# Patient Record
Sex: Female | Born: 1985 | Race: White | Hispanic: No | Marital: Married | Smoking: Never smoker
Health system: Southern US, Community
[De-identification: ages and names within clinical notes are randomized; demographics above are authoritative.]

## PROBLEM LIST (undated history)

## (undated) ENCOUNTER — Inpatient Hospital Stay (HOSPITAL_COMMUNITY): Payer: Self-pay

## (undated) DIAGNOSIS — F419 Anxiety disorder, unspecified: Secondary | ICD-10-CM

## (undated) DIAGNOSIS — Z789 Other specified health status: Secondary | ICD-10-CM

## (undated) DIAGNOSIS — K219 Gastro-esophageal reflux disease without esophagitis: Secondary | ICD-10-CM

## (undated) DIAGNOSIS — I499 Cardiac arrhythmia, unspecified: Secondary | ICD-10-CM

## (undated) DIAGNOSIS — R768 Other specified abnormal immunological findings in serum: Secondary | ICD-10-CM

## (undated) HISTORY — DX: Other specified health status: Z78.9

## (undated) HISTORY — DX: Other specified abnormal immunological findings in serum: R76.8

## (undated) HISTORY — PX: NO PAST SURGERIES: SHX2092

---

## 2012-07-20 DIAGNOSIS — R768 Other specified abnormal immunological findings in serum: Secondary | ICD-10-CM

## 2012-07-20 HISTORY — DX: Other specified abnormal immunological findings in serum: R76.8

## 2012-07-20 NOTE — L&D Delivery Note (Signed)
Delivery Note At 10:02 AM a viable female was delivered via Vaginal, Spontaneous Delivery (Presentation: ; Right Occiput Anterior).  APGAR: 9, 9; weight: not available at time of note.  Infant w/ spontaneous cry/respiration, placed directly on mom's abdomen for bonding/skin to skin. Cord allowed to stop pulsing, and was clamped x 2, and cut by fob.     Placenta status: Intact, Spontaneous.  Cord: 3 vessels with the following complications: placenta noted to be circummarginate .    Anesthesia: Local  Episiotomy: None Lacerations: 1st degree;Sulcus;Perineal Suture Repair: 3.0 vicryl Est. Blood Loss (mL): 450  Mom to postpartum.  Baby to nursery-stable. Plans to breastfeed, undecided about contraception, plans inpatient circumcision  Marge Duncans 04/15/2013, 11:06 AM

## 2012-07-20 NOTE — L&D Delivery Note (Signed)
Attestation of Attending Supervision of Advanced Practitioner (CNM/NP): Evaluation and management procedures were performed by the Advanced Practitioner under my supervision and collaboration.  I have reviewed the Advanced Practitioner's note and chart, and I agree with the management and plan.  HARRAWAY-SMITH, Tyshea Imel 11:52 AM     

## 2012-09-07 LAB — OB RESULTS CONSOLE ANTIBODY SCREEN: Antibody Screen: NEGATIVE

## 2012-09-07 LAB — OB RESULTS CONSOLE VARICELLA ZOSTER ANTIBODY, IGG: Varicella: IMMUNE

## 2012-09-07 LAB — OB RESULTS CONSOLE ABO/RH: RH Type: POSITIVE

## 2012-09-07 LAB — OB RESULTS CONSOLE GC/CHLAMYDIA: Gonorrhea: NEGATIVE

## 2012-09-07 LAB — OB RESULTS CONSOLE RUBELLA ANTIBODY, IGM: Rubella: IMMUNE

## 2012-09-26 ENCOUNTER — Other Ambulatory Visit: Payer: Self-pay | Admitting: Obstetrics & Gynecology

## 2012-09-26 DIAGNOSIS — Z1389 Encounter for screening for other disorder: Secondary | ICD-10-CM

## 2012-10-07 ENCOUNTER — Ambulatory Visit (INDEPENDENT_AMBULATORY_CARE_PROVIDER_SITE_OTHER): Payer: PRIVATE HEALTH INSURANCE

## 2012-10-07 ENCOUNTER — Ambulatory Visit (INDEPENDENT_AMBULATORY_CARE_PROVIDER_SITE_OTHER): Payer: PRIVATE HEALTH INSURANCE | Admitting: Obstetrics & Gynecology

## 2012-10-07 VITALS — BP 130/80 | Wt 141.0 lb

## 2012-10-07 DIAGNOSIS — IMO0002 Reserved for concepts with insufficient information to code with codable children: Secondary | ICD-10-CM

## 2012-10-07 DIAGNOSIS — Z3481 Encounter for supervision of other normal pregnancy, first trimester: Secondary | ICD-10-CM

## 2012-10-07 DIAGNOSIS — Z1389 Encounter for screening for other disorder: Secondary | ICD-10-CM

## 2012-10-07 LAB — POCT URINALYSIS DIPSTICK
Blood, UA: NEGATIVE
Glucose, UA: NEGATIVE
Ketones, UA: NEGATIVE

## 2012-10-07 NOTE — Progress Notes (Signed)
U/S(11+6wks)-Twin IUP, with 1 viable pregnancy, CRL c/w LMP dates and 1 non-viable pregnancy ("Empty Sac" no yolk sac or fetal pole noted)=2.7x1.4cm, cx long and closed, bilateral adnexa wnl

## 2012-10-07 NOTE — Patient Instructions (Signed)
Pregnancy - First Trimester During sexual intercourse, millions of sperm go into the vagina. Only 1 sperm will penetrate and fertilize the female egg while it is in the Fallopian tube. One week later, the fertilized egg implants into the wall of the uterus. An embryo begins to develop into a baby. At 6 to 8 weeks, the eyes and face are formed and the heartbeat can be seen on ultrasound. At the end of 12 weeks (first trimester), all the baby's organs are formed. Now that you are pregnant, you will want to do everything you can to have a healthy baby. Two of the most important things are to get good prenatal care and follow your caregiver's instructions. Prenatal care is all the medical care you receive before the baby's birth. It is given to prevent, find, and treat problems during the pregnancy and childbirth. PRENATAL EXAMS  During prenatal visits, your weight, blood pressure and urine are checked. This is done to make sure you are healthy and progressing normally during the pregnancy.  A pregnant woman should gain 25 to 35 pounds during the pregnancy. However, if you are over weight or underweight, your caregiver will advise you regarding your weight.  Your caregiver will ask and answer questions for you.  Blood work, cervical cultures, other necessary tests and a Pap test are done during your prenatal exams. These tests are done to check on your health and the probable health of your baby. Tests are strongly recommended and done for HIV with your permission. This is the virus that causes AIDS. These tests are done because medications can be given to help prevent your baby from being born with this infection should you have been infected without knowing it. Blood work is also used to find out your blood type, previous infections and follow your blood levels (hemoglobin).  Low hemoglobin (anemia) is common during pregnancy. Iron and vitamins are given to help prevent this. Later in the pregnancy, blood  tests for diabetes will be done along with any other tests if any problems develop. You may need tests to make sure you and the baby are doing well.  You may need other tests to make sure you and the baby are doing well. CHANGES DURING THE FIRST TRIMESTER (THE FIRST 3 MONTHS OF PREGNANCY) Your body goes through many changes during pregnancy. They vary from person to person. Talk to your caregiver about changes you notice and are concerned about. Changes can include:  Your menstrual period stops.  The egg and sperm carry the genes that determine what you look like. Genes from you and your partner are forming a baby. The female genes determine whether the baby is a boy or a girl.  Your body increases in girth and you may feel bloated.  Feeling sick to your stomach (nauseous) and throwing up (vomiting). If the vomiting is uncontrollable, call your caregiver.  Your breasts will begin to enlarge and become tender.  Your nipples may stick out more and become darker.  The need to urinate more. Painful urination may mean you have a bladder infection.  Tiring easily.  Loss of appetite.  Cravings for certain kinds of food.  At first, you may gain or lose a couple of pounds.  You may have changes in your emotions from day to day (excited to be pregnant or concerned something may go wrong with the pregnancy and baby).  You may have more vivid and strange dreams. HOME CARE INSTRUCTIONS   It is very important   to avoid all smoking, alcohol and un-prescribed drugs during your pregnancy. These affect the formation and growth of the baby. Avoid chemicals while pregnant to ensure the delivery of a healthy infant.  Start your prenatal visits by the 12th week of pregnancy. They are usually scheduled monthly at first, then more often in the last 2 months before delivery. Keep your caregiver's appointments. Follow your caregiver's instructions regarding medication use, blood and lab tests, exercise, and  diet.  During pregnancy, you are providing food for you and your baby. Eat regular, well-balanced meals. Choose foods such as meat, fish, milk and other low fat dairy products, vegetables, fruits, and whole-grain breads and cereals. Your caregiver will tell you of the ideal weight gain.  You can help morning sickness by keeping soda crackers at the bedside. Eat a couple before arising in the morning. You may want to use the crackers without salt on them.  Eating 4 to 5 small meals rather than 3 large meals a day also may help the nausea and vomiting.  Drinking liquids between meals instead of during meals also seems to help nausea and vomiting.  A physical sexual relationship may be continued throughout pregnancy if there are no other problems. Problems may be early (premature) leaking of amniotic fluid from the membranes, vaginal bleeding, or belly (abdominal) pain.  Exercise regularly if there are no restrictions. Check with your caregiver or physical therapist if you are unsure of the safety of some of your exercises. Greater weight gain will occur in the last 2 trimesters of pregnancy. Exercising will help:  Control your weight.  Keep you in shape.  Prepare you for labor and delivery.  Help you lose your pregnancy weight after you deliver your baby.  Wear a good support or jogging bra for breast tenderness during pregnancy. This may help if worn during sleep too.  Ask when prenatal classes are available. Begin classes when they are offered.  Do not use hot tubs, steam rooms or saunas.  Wear your seat belt when driving. This protects you and your baby if you are in an accident.  Avoid raw meat, uncooked cheese, cat litter boxes and soil used by cats throughout the pregnancy. These carry germs that can cause birth defects in the baby.  The first trimester is a good time to visit your dentist for your dental health. Getting your teeth cleaned is OK. Use a softer toothbrush and brush  gently during pregnancy.  Ask for help if you have financial, counseling or nutritional needs during pregnancy. Your caregiver will be able to offer counseling for these needs as well as refer you for other special needs.  Do not take any medications or herbs unless told by your caregiver.  Inform your caregiver if there is any mental or physical domestic violence.  Make a list of emergency phone numbers of family, friends, hospital, and police and fire departments.  Write down your questions. Take them to your prenatal visit.  Do not douche.  Do not cross your legs.  If you have to stand for long periods of time, rotate you feet or take small steps in a circle.  You may have more vaginal secretions that may require a sanitary pad. Do not use tampons or scented sanitary pads. MEDICATIONS AND DRUG USE IN PREGNANCY  Take prenatal vitamins as directed. The vitamin should contain 1 milligram of folic acid. Keep all vitamins out of reach of children. Only a couple vitamins or tablets containing iron may be   fatal to a baby or young child when ingested.  Avoid use of all medications, including herbs, over-the-counter medications, not prescribed or suggested by your caregiver. Only take over-the-counter or prescription medicines for pain, discomfort, or fever as directed by your caregiver. Do not use aspirin, ibuprofen, or naproxen unless directed by your caregiver.  Let your caregiver also know about herbs you may be using.  Alcohol is related to a number of birth defects. This includes fetal alcohol syndrome. All alcohol, in any form, should be avoided completely. Smoking will cause low birth rate and premature babies.  Street or illegal drugs are very harmful to the baby. They are absolutely forbidden. A baby born to an addicted mother will be addicted at birth. The baby will go through the same withdrawal an adult does.  Let your caregiver know about any medications that you have to take  and for what reason you take them. MISCARRIAGE IS COMMON DURING PREGNANCY A miscarriage does not mean you did something wrong. It is not a reason to worry about getting pregnant again. Your caregiver will help you with questions you may have. If you have a miscarriage, you may need minor surgery. SEEK MEDICAL CARE IF:  You have any concerns or worries during your pregnancy. It is better to call with your questions if you feel they cannot wait, rather than worry about them. SEEK IMMEDIATE MEDICAL CARE IF:   An unexplained oral temperature above 102 F (38.9 C) develops, or as your caregiver suggests.  You have leaking of fluid from the vagina (birth canal). If leaking membranes are suspected, take your temperature and inform your caregiver of this when you call.  There is vaginal spotting or bleeding. Notify your caregiver of the amount and how many pads are used.  You develop a bad smelling vaginal discharge with a change in the color.  You continue to feel sick to your stomach (nauseated) and have no relief from remedies suggested. You vomit blood or coffee ground-like materials.  You lose more than 2 pounds of weight in 1 week.  You gain more than 2 pounds of weight in 1 week and you notice swelling of your face, hands, feet, or legs.  You gain 5 pounds or more in 1 week (even if you do not have swelling of your hands, face, legs, or feet).  You get exposed to German measles and have never had them.  You are exposed to fifth disease or chickenpox.  You develop belly (abdominal) pain. Round ligament discomfort is a common non-cancerous (benign) cause of abdominal pain in pregnancy. Your caregiver still must evaluate this.  You develop headache, fever, diarrhea, pain with urination, or shortness of breath.  You fall or are in a car accident or have any kind of trauma.  There is mental or physical violence in your home. Document Released: 06/30/2001 Document Revised: 09/28/2011  Document Reviewed: 01/01/2009 [employer] Patient Information 2013 [employer].  

## 2012-10-07 NOTE — Progress Notes (Signed)
Tina Osborn in today see sonogram report.  The patient declines any bleeding, no complaints except nausea no emesis.All questions were answered. Patient declines integrated testing and probably no Quad screen.

## 2012-10-09 LAB — US OB COMP LESS 14 WKS

## 2012-10-25 ENCOUNTER — Telehealth: Payer: Self-pay | Admitting: Obstetrics and Gynecology

## 2012-10-25 NOTE — Telephone Encounter (Signed)
Spoke with pt. Benadryl is safe during pregnancy. Advised may cause drowsiness. Advised if no improvement after a few days, call us back. Pt viced understanding.

## 2012-11-02 ENCOUNTER — Encounter: Payer: Self-pay | Admitting: *Deleted

## 2012-11-03 ENCOUNTER — Ambulatory Visit (INDEPENDENT_AMBULATORY_CARE_PROVIDER_SITE_OTHER): Payer: PRIVATE HEALTH INSURANCE | Admitting: Obstetrics & Gynecology

## 2012-11-03 VITALS — BP 122/70 | Ht 66.0 in | Wt 147.2 lb

## 2012-11-03 DIAGNOSIS — IMO0002 Reserved for concepts with insufficient information to code with codable children: Secondary | ICD-10-CM

## 2012-11-03 DIAGNOSIS — O0992 Supervision of high risk pregnancy, unspecified, second trimester: Secondary | ICD-10-CM

## 2012-11-03 DIAGNOSIS — O0990 Supervision of high risk pregnancy, unspecified, unspecified trimester: Secondary | ICD-10-CM

## 2012-11-03 NOTE — Progress Notes (Signed)
C/o hip pain, nausea, weakness, taking b/p at home was 104/68.

## 2012-11-03 NOTE — Patient Instructions (Signed)
Pregnancy - Second Trimester The second trimester of pregnancy (3 to 6 months) is a period of rapid growth for you and your baby. At the end of the sixth month, your baby is about 9 inches long and weighs 1 1/2 pounds. You will begin to feel the baby move between 18 and 20 weeks of the pregnancy. This is called quickening. Weight gain is faster. A clear fluid (colostrum) may leak out of your breasts. You may feel small contractions of the womb (uterus). This is known as false labor or Braxton-Hicks contractions. This is like a practice for labor when the baby is ready to be born. Usually, the problems with morning sickness have usually passed by the end of your first trimester. Some women develop small dark blotches (called cholasma, mask of pregnancy) on their face that usually goes away after the baby is born. Exposure to the sun makes the blotches worse. Acne may also develop in some pregnant women and pregnant women who have acne, may find that it goes away. PRENATAL EXAMS  Blood work may continue to be done during prenatal exams. These tests are done to check on your health and the probable health of your baby. Blood work is used to follow your blood levels (hemoglobin). Anemia (low hemoglobin) is common during pregnancy. Iron and vitamins are given to help prevent this. You will also be checked for diabetes between 24 and 28 weeks of the pregnancy. Some of the previous blood tests may be repeated.  The size of the uterus is measured during each visit. This is to make sure that the baby is continuing to grow properly according to the dates of the pregnancy.  Your blood pressure is checked every prenatal visit. This is to make sure you are not getting toxemia.  Your urine is checked to make sure you do not have an infection, diabetes or protein in the urine.  Your weight is checked often to make sure gains are happening at the suggested rate. This is to ensure that both you and your baby are growing  normally.  Sometimes, an ultrasound is performed to confirm the proper growth and development of the baby. This is a test which bounces harmless sound waves off the baby so your caregiver can more accurately determine due dates. Sometimes, a specialized test is done on the amniotic fluid surrounding the baby. This test is called an amniocentesis. The amniotic fluid is obtained by sticking a needle into the belly (abdomen). This is done to check the chromosomes in instances where there is a concern about possible genetic problems with the baby. It is also sometimes done near the end of pregnancy if an early delivery is required. In this case, it is done to help make sure the baby's lungs are mature enough for the baby to live outside of the womb. CHANGES OCCURING IN THE SECOND TRIMESTER OF PREGNANCY Your body goes through many changes during pregnancy. They vary from person to person. Talk to your caregiver about changes you notice that you are concerned about.  During the second trimester, you will likely have an increase in your appetite. It is normal to have cravings for certain foods. This varies from person to person and pregnancy to pregnancy.  Your lower abdomen will begin to bulge.  You may have to urinate more often because the uterus and baby are pressing on your bladder. It is also common to get more bladder infections during pregnancy (pain with urination). You can help this by   drinking lots of fluids and emptying your bladder before and after intercourse.  You may begin to get stretch marks on your hips, abdomen, and breasts. These are normal changes in the body during pregnancy. There are no exercises or medications to take that prevent this change.  You may begin to develop swollen and bulging veins (varicose veins) in your legs. Wearing support hose, elevating your feet for 15 minutes, 3 to 4 times a day and limiting salt in your diet helps lessen the problem.  Heartburn may develop  as the uterus grows and pushes up against the stomach. Antacids recommended by your caregiver helps with this problem. Also, eating smaller meals 4 to 5 times a day helps.  Constipation can be treated with a stool softener or adding bulk to your diet. Drinking lots of fluids, vegetables, fruits, and whole grains are helpful.  Exercising is also helpful. If you have been very active up until your pregnancy, most of these activities can be continued during your pregnancy. If you have been less active, it is helpful to start an exercise program such as walking.  Hemorrhoids (varicose veins in the rectum) may develop at the end of the second trimester. Warm sitz baths and hemorrhoid cream recommended by your caregiver helps hemorrhoid problems.  Backaches may develop during this time of your pregnancy. Avoid heavy lifting, wear low heal shoes and practice good posture to help with backache problems.  Some pregnant women develop tingling and numbness of their hand and fingers because of swelling and tightening of ligaments in the wrist (carpel tunnel syndrome). This goes away after the baby is born.  As your breasts enlarge, you may have to get a bigger bra. Get a comfortable, cotton, support bra. Do not get a nursing bra until the last month of the pregnancy if you will be nursing the baby.  You may get a dark line from your belly button to the pubic area called the linea nigra.  You may develop rosy cheeks because of increase blood flow to the face.  You may develop spider looking lines of the face, neck, arms and chest. These go away after the baby is born. HOME CARE INSTRUCTIONS   It is extremely important to avoid all smoking, herbs, alcohol, and unprescribed drugs during your pregnancy. These chemicals affect the formation and growth of the baby. Avoid these chemicals throughout the pregnancy to ensure the delivery of a healthy infant.  Most of your home care instructions are the same as  suggested for the first trimester of your pregnancy. Keep your caregiver's appointments. Follow your caregiver's instructions regarding medication use, exercise and diet.  During pregnancy, you are providing food for you and your baby. Continue to eat regular, well-balanced meals. Choose foods such as meat, fish, milk and other low fat dairy products, vegetables, fruits, and whole-grain breads and cereals. Your caregiver will tell you of the ideal weight gain.  A physical sexual relationship may be continued up until near the end of pregnancy if there are no other problems. Problems could include early (premature) leaking of amniotic fluid from the membranes, vaginal bleeding, abdominal pain, or other medical or pregnancy problems.  Exercise regularly if there are no restrictions. Check with your caregiver if you are unsure of the safety of some of your exercises. The greatest weight gain will occur in the last 2 trimesters of pregnancy. Exercise will help you:  Control your weight.  Get you in shape for labor and delivery.  Lose weight   after you have the baby.  Wear a good support or jogging bra for breast tenderness during pregnancy. This may help if worn during sleep. Pads or tissues may be used in the bra if you are leaking colostrum.  Do not use hot tubs, steam rooms or saunas throughout the pregnancy.  Wear your seat belt at all times when driving. This protects you and your baby if you are in an accident.  Avoid raw meat, uncooked cheese, cat litter boxes and soil used by cats. These carry germs that can cause birth defects in the baby.  The second trimester is also a good time to visit your dentist for your dental health if this has not been done yet. Getting your teeth cleaned is OK. Use a soft toothbrush. Brush gently during pregnancy.  It is easier to loose urine during pregnancy. Tightening up and strengthening the pelvic muscles will help with this problem. Practice stopping your  urination while you are going to the bathroom. These are the same muscles you need to strengthen. It is also the muscles you would use as if you were trying to stop from passing gas. You can practice tightening these muscles up 10 times a set and repeating this about 3 times per day. Once you know what muscles to tighten up, do not perform these exercises during urination. It is more likely to contribute to an infection by backing up the urine.  Ask for help if you have financial, counseling or nutritional needs during pregnancy. Your caregiver will be able to offer counseling for these needs as well as refer you for other special needs.  Your skin may become oily. If so, wash your face with mild soap, use non-greasy moisturizer and oil or cream based makeup. MEDICATIONS AND DRUG USE IN PREGNANCY  Take prenatal vitamins as directed. The vitamin should contain 1 milligram of folic acid. Keep all vitamins out of reach of children. Only a couple vitamins or tablets containing iron may be fatal to a baby or young child when ingested.  Avoid use of all medications, including herbs, over-the-counter medications, not prescribed or suggested by your caregiver. Only take over-the-counter or prescription medicines for pain, discomfort, or fever as directed by your caregiver. Do not use aspirin.  Let your caregiver also know about herbs you may be using.  Alcohol is related to a number of birth defects. This includes fetal alcohol syndrome. All alcohol, in any form, should be avoided completely. Smoking will cause low birth rate and premature babies.  Street or illegal drugs are very harmful to the baby. They are absolutely forbidden. A baby born to an addicted mother will be addicted at birth. The baby will go through the same withdrawal an adult does. SEEK MEDICAL CARE IF:  You have any concerns or worries during your pregnancy. It is better to call with your questions if you feel they cannot wait, rather  than worry about them. SEEK IMMEDIATE MEDICAL CARE IF:   An unexplained oral temperature above 102 F (38.9 C) develops, or as your caregiver suggests.  You have leaking of fluid from the vagina (birth canal). If leaking membranes are suspected, take your temperature and tell your caregiver of this when you call.  There is vaginal spotting, bleeding, or passing clots. Tell your caregiver of the amount and how many pads are used. Light spotting in pregnancy is common, especially following intercourse.  You develop a bad smelling vaginal discharge with a change in the color from clear   to white.  You continue to feel sick to your stomach (nauseated) and have no relief from remedies suggested. You vomit blood or coffee ground-like materials.  You lose more than 2 pounds of weight or gain more than 2 pounds of weight over 1 week, or as suggested by your caregiver.  You notice swelling of your face, hands, feet, or legs.  You get exposed to German measles and have never had them.  You are exposed to fifth disease or chickenpox.  You develop belly (abdominal) pain. Round ligament discomfort is a common non-cancerous (benign) cause of abdominal pain in pregnancy. Your caregiver still must evaluate you.  You develop a bad headache that does not go away.  You develop fever, diarrhea, pain with urination, or shortness of breath.  You develop visual problems, blurry, or double vision.  You fall or are in a car accident or any kind of trauma.  There is mental or physical violence at home. Document Released: 06/30/2001 Document Revised: 09/28/2011 Document Reviewed: 01/02/2009 [employer] Patient Information 2013 [employer].  

## 2012-11-03 NOTE — Progress Notes (Signed)
No bleeding or problems no complaints, feels good some nausea no vomiting All questions answered, declines Quad screen or IT

## 2012-11-16 ENCOUNTER — Encounter: Payer: Self-pay | Admitting: Obstetrics & Gynecology

## 2012-11-16 ENCOUNTER — Ambulatory Visit (INDEPENDENT_AMBULATORY_CARE_PROVIDER_SITE_OTHER): Payer: PRIVATE HEALTH INSURANCE | Admitting: Obstetrics & Gynecology

## 2012-11-16 VITALS — BP 120/80 | Temp 98.6°F | Wt 148.0 lb

## 2012-11-16 DIAGNOSIS — Z1389 Encounter for screening for other disorder: Secondary | ICD-10-CM

## 2012-11-16 DIAGNOSIS — J329 Chronic sinusitis, unspecified: Secondary | ICD-10-CM

## 2012-11-16 DIAGNOSIS — Z331 Pregnant state, incidental: Secondary | ICD-10-CM

## 2012-11-16 LAB — POCT URINALYSIS DIPSTICK
Blood, UA: NEGATIVE
Glucose, UA: NEGATIVE

## 2012-11-16 MED ORDER — AMOXICILLIN-POT CLAVULANATE 875-125 MG PO TABS: 1.0000 | ORAL_TABLET | Freq: Two times a day (BID) | ORAL | Status: DC

## 2012-11-16 NOTE — Progress Notes (Signed)
HERE TODAY FOR HEAD AND CONGESTION. STARTED A FEW DAY AGO.ALSO COUGHING, GREENISH IN COLOR.

## 2012-11-16 NOTE — Patient Instructions (Signed)

## 2012-11-16 NOTE — Progress Notes (Signed)
Has long history of recurrent sinusitis, generally has 2-3 per year. Green coming out now. + pressure over sinuses Lungs clear Augmentin 875 bid x 10 days given , OTC meds as well

## 2012-12-01 ENCOUNTER — Ambulatory Visit (INDEPENDENT_AMBULATORY_CARE_PROVIDER_SITE_OTHER): Payer: PRIVATE HEALTH INSURANCE

## 2012-12-01 ENCOUNTER — Other Ambulatory Visit: Payer: Self-pay | Admitting: Obstetrics & Gynecology

## 2012-12-01 ENCOUNTER — Ambulatory Visit (INDEPENDENT_AMBULATORY_CARE_PROVIDER_SITE_OTHER): Payer: PRIVATE HEALTH INSURANCE | Admitting: Advanced Practice Midwife

## 2012-12-01 ENCOUNTER — Encounter: Payer: Self-pay | Admitting: Advanced Practice Midwife

## 2012-12-01 VITALS — BP 118/72 | Wt 152.5 lb

## 2012-12-01 DIAGNOSIS — Z331 Pregnant state, incidental: Secondary | ICD-10-CM

## 2012-12-01 DIAGNOSIS — IMO0002 Reserved for concepts with insufficient information to code with codable children: Secondary | ICD-10-CM

## 2012-12-01 DIAGNOSIS — O0992 Supervision of high risk pregnancy, unspecified, second trimester: Secondary | ICD-10-CM

## 2012-12-01 DIAGNOSIS — O0990 Supervision of high risk pregnancy, unspecified, unspecified trimester: Secondary | ICD-10-CM

## 2012-12-01 DIAGNOSIS — Z1389 Encounter for screening for other disorder: Secondary | ICD-10-CM

## 2012-12-01 DIAGNOSIS — O09299 Supervision of pregnancy with other poor reproductive or obstetric history, unspecified trimester: Secondary | ICD-10-CM

## 2012-12-01 LAB — POCT URINALYSIS DIPSTICK
Glucose, UA: NEGATIVE
Nitrite, UA: NEGATIVE
Protein, UA: NEGATIVE

## 2012-12-01 NOTE — Assessment & Plan Note (Deleted)
Clinic:[facility]  Genetic Screen NT:                                        Quad Screen/MSAFP:  Anatomic US   Glucose Screen   GBS   Feeding Preference   Contraception   Circumcision     

## 2012-12-01 NOTE — Progress Notes (Signed)
U/s, anat screen complete, meas. c/w dates, active fetus, female, cx closed  = 4.9 cm, edc = 10-04=2014

## 2012-12-01 NOTE — Progress Notes (Signed)
Pain in left side. Daughter climbed in lap yesterday and hit her above belly button. Felt numbness at first and then it went away.

## 2012-12-01 NOTE — Progress Notes (Signed)
Had anatomy scan today. Pain in left side sounds musculoskeletal.  Routine questions about pregnancy answered.  F/U in 4 weeks for LROB.

## 2012-12-06 LAB — US OB DETAIL + 14 WK

## 2012-12-29 ENCOUNTER — Ambulatory Visit (INDEPENDENT_AMBULATORY_CARE_PROVIDER_SITE_OTHER): Payer: PRIVATE HEALTH INSURANCE | Admitting: Advanced Practice Midwife

## 2012-12-29 ENCOUNTER — Encounter: Payer: Self-pay | Admitting: Advanced Practice Midwife

## 2012-12-29 VITALS — BP 110/78 | Wt 169.0 lb

## 2012-12-29 DIAGNOSIS — Z1389 Encounter for screening for other disorder: Secondary | ICD-10-CM

## 2012-12-29 DIAGNOSIS — Z331 Pregnant state, incidental: Secondary | ICD-10-CM

## 2012-12-29 DIAGNOSIS — O09299 Supervision of pregnancy with other poor reproductive or obstetric history, unspecified trimester: Secondary | ICD-10-CM

## 2012-12-29 LAB — POCT URINALYSIS DIPSTICK
Glucose, UA: NEGATIVE
Ketones, UA: NEGATIVE

## 2012-12-29 NOTE — Progress Notes (Signed)
No c/o at this time.Felt sore when waking up a day or so ago.  Feels like bay has turned.  Routine questions about pregnancy answered.  F/U in 4 weeks for PN2 and LROB.

## 2012-12-29 NOTE — Patient Instructions (Addendum)
Nothing to eat or drink after midnight before your next appointment & plan to be here for 2 hours (for your sugar test).  

## 2012-12-29 NOTE — Progress Notes (Signed)
Pain in belly. Thinks he may have turned.

## 2013-01-02 ENCOUNTER — Inpatient Hospital Stay (HOSPITAL_COMMUNITY): Admission: AD | Admit: 2013-01-02 | Payer: Self-pay | Source: Ambulatory Visit | Admitting: Obstetrics & Gynecology

## 2013-01-26 ENCOUNTER — Ambulatory Visit (INDEPENDENT_AMBULATORY_CARE_PROVIDER_SITE_OTHER): Payer: PRIVATE HEALTH INSURANCE | Admitting: Obstetrics & Gynecology

## 2013-01-26 ENCOUNTER — Other Ambulatory Visit: Payer: PRIVATE HEALTH INSURANCE

## 2013-01-26 ENCOUNTER — Encounter: Payer: Self-pay | Admitting: Obstetrics & Gynecology

## 2013-01-26 VITALS — BP 110/80 | Wt 168.0 lb

## 2013-01-26 DIAGNOSIS — Z3482 Encounter for supervision of other normal pregnancy, second trimester: Secondary | ICD-10-CM

## 2013-01-26 DIAGNOSIS — Z3483 Encounter for supervision of other normal pregnancy, third trimester: Secondary | ICD-10-CM

## 2013-01-26 DIAGNOSIS — Z3480 Encounter for supervision of other normal pregnancy, unspecified trimester: Secondary | ICD-10-CM | POA: Insufficient documentation

## 2013-01-26 DIAGNOSIS — Z1389 Encounter for screening for other disorder: Secondary | ICD-10-CM

## 2013-01-26 DIAGNOSIS — O09299 Supervision of pregnancy with other poor reproductive or obstetric history, unspecified trimester: Secondary | ICD-10-CM

## 2013-01-26 LAB — POCT URINALYSIS DIPSTICK
Blood, UA: NEGATIVE
Ketones, UA: NEGATIVE
Protein, UA: NEGATIVE

## 2013-01-26 LAB — CBC
HCT: 36.2 % (ref 36.0–46.0)
Hemoglobin: 12.6 g/dL (ref 12.0–15.0)
MCHC: 34.8 g/dL (ref 30.0–36.0)
RBC: 4.19 MIL/uL (ref 3.87–5.11)

## 2013-01-26 NOTE — Progress Notes (Signed)
BP weight and urine results all reviewed and noted. Patient reports good fetal movement, denies any bleeding and no rupture of membranes symptoms or regular contractions. Patient is without complaints. All questions were answered.  

## 2013-01-26 NOTE — Patient Instructions (Signed)

## 2013-01-26 NOTE — Progress Notes (Signed)
FOR PN2 TODAY. 

## 2013-01-27 LAB — GLUCOSE TOLERANCE, 2 HOURS W/ 1HR
Glucose, 1 hour: 80 mg/dL (ref 70–170)
Glucose, 2 hour: 86 mg/dL (ref 70–139)
Glucose, Fasting: 67 mg/dL — ABNORMAL LOW (ref 70–99)

## 2013-01-27 LAB — HSV 2 ANTIBODY, IGG: HSV 2 Glycoprotein G Ab, IgG: 8.57 IV — ABNORMAL HIGH

## 2013-01-27 LAB — RPR

## 2013-02-15 ENCOUNTER — Telehealth: Payer: Self-pay | Admitting: *Deleted

## 2013-02-15 ENCOUNTER — Ambulatory Visit (INDEPENDENT_AMBULATORY_CARE_PROVIDER_SITE_OTHER): Payer: PRIVATE HEALTH INSURANCE | Admitting: Advanced Practice Midwife

## 2013-02-15 VITALS — BP 118/78 | Wt 172.0 lb

## 2013-02-15 DIAGNOSIS — Z331 Pregnant state, incidental: Secondary | ICD-10-CM

## 2013-02-15 DIAGNOSIS — Z1389 Encounter for screening for other disorder: Secondary | ICD-10-CM

## 2013-02-15 DIAGNOSIS — O09299 Supervision of pregnancy with other poor reproductive or obstetric history, unspecified trimester: Secondary | ICD-10-CM

## 2013-02-15 LAB — POCT URINALYSIS DIPSTICK
Blood, UA: NEGATIVE
Ketones, UA: NEGATIVE
Protein, UA: NEGATIVE

## 2013-02-15 NOTE — Progress Notes (Signed)
C/O being dizzy this morning. Ate breakfast. She took her b/p at home and it was 94/68 .  Discussed sluggish baroreceptors and offered advice to deal with dizzy spells.

## 2013-02-15 NOTE — Patient Instructions (Addendum)
Vertigo Vertigo means you feel like you or your surroundings are moving when they are not. Vertigo can be dangerous if it occurs when you are at work, driving, or performing difficult activities.  CAUSES  Vertigo occurs when there is a conflict of signals sent to your brain from the visual and sensory systems in your body. There are many different causes of vertigo, including:  Infections, especially in the inner ear.  A bad reaction to a drug or misuse of alcohol and medicines.  Withdrawal from drugs or alcohol.  Rapidly changing positions, such as lying down or rolling over in bed.  A migraine headache.  Decreased blood flow to the brain.  Increased pressure in the brain from a head injury, infection, tumor, or bleeding. SYMPTOMS  You may feel as though the world is spinning around or you are falling to the ground. Because your balance is upset, vertigo can cause nausea and vomiting. You may have involuntary eye movements (nystagmus). DIAGNOSIS  Vertigo is usually diagnosed by physical exam. If the cause of your vertigo is unknown, your caregiver may perform imaging tests, such as an MRI scan (magnetic resonance imaging). TREATMENT  Most cases of vertigo resolve on their own, without treatment. Depending on the cause, your caregiver may prescribe certain medicines. If your vertigo is related to body position issues, your caregiver may recommend movements or procedures to correct the problem. In rare cases, if your vertigo is caused by certain inner ear problems, you may need surgery. HOME CARE INSTRUCTIONS   Follow your caregiver's instructions.  Avoid driving.  Avoid operating heavy machinery.  Avoid performing any tasks that would be dangerous to you or others during a vertigo episode.  Tell your caregiver if you notice that certain medicines seem to be causing your vertigo. Some of the medicines used to treat vertigo episodes can actually make them worse in some people. SEEK  IMMEDIATE MEDICAL CARE IF:   Your medicines do not relieve your vertigo or are making it worse.  You develop problems with talking, walking, weakness, or using your arms, hands, or legs.  You develop severe headaches.  Your nausea or vomiting continues or gets worse.  You develop visual changes.  A family member notices behavioral changes.  Your condition gets worse. MAKE SURE YOU:  Understand these instructions.  Will watch your condition.  Will get help right away if you are not doing well or get worse. Document Released: 04/15/2005 Document Revised: 09/28/2011 Document Reviewed: 01/22/2011 [employer] Patient Information 2014 [employer].  

## 2013-02-16 ENCOUNTER — Encounter: Payer: PRIVATE HEALTH INSURANCE | Admitting: Advanced Practice Midwife

## 2013-02-17 NOTE — Telephone Encounter (Signed)
Pt came in.  Discussed with nurse before completing this message.

## 2013-03-07 ENCOUNTER — Encounter (HOSPITAL_COMMUNITY): Payer: Self-pay | Admitting: *Deleted

## 2013-03-07 ENCOUNTER — Inpatient Hospital Stay (HOSPITAL_COMMUNITY)
Admission: AD | Admit: 2013-03-07 | Discharge: 2013-03-07 | Disposition: A | Discharge status: Home / Self Care | Payer: PRIVATE HEALTH INSURANCE | Source: Ambulatory Visit | Attending: Obstetrics and Gynecology | Admitting: Obstetrics and Gynecology

## 2013-03-07 DIAGNOSIS — O4700 False labor before 37 completed weeks of gestation, unspecified trimester: Secondary | ICD-10-CM | POA: Insufficient documentation

## 2013-03-07 LAB — URINALYSIS, ROUTINE W REFLEX MICROSCOPIC
Bilirubin Urine: NEGATIVE
Ketones, ur: NEGATIVE mg/dL
Nitrite: NEGATIVE
Protein, ur: NEGATIVE mg/dL
Urobilinogen, UA: 0.2 mg/dL (ref 0.0–1.0)
pH: 7.5 (ref 5.0–8.0)

## 2013-03-07 LAB — GC/CHLAMYDIA PROBE AMP
CT Probe RNA: NEGATIVE
GC Probe RNA: NEGATIVE

## 2013-03-07 LAB — URINE MICROSCOPIC-ADD ON

## 2013-03-07 LAB — WET PREP, GENITAL: Trich, Wet Prep: NONE SEEN

## 2013-03-07 MED ORDER — NIFEDIPINE 10 MG PO CAPS: 10.0000 mg | ORAL_CAPSULE | Freq: Three times a day (TID) | ORAL | Status: DC

## 2013-03-07 MED ORDER — BETAMETHASONE SOD PHOS & ACET 6 (3-3) MG/ML IJ SUSP
12.0000 mg | Freq: Once | INTRAMUSCULAR | Status: AC
  Administered 2013-03-07: 12 mg via INTRAMUSCULAR
  Filled 2013-03-07: qty 2

## 2013-03-07 MED ORDER — NIFEDIPINE 10 MG PO CAPS
10.0000 mg | ORAL_CAPSULE | ORAL | Status: AC
  Administered 2013-03-07 (×3): 10 mg via ORAL
  Filled 2013-03-07 (×3): qty 1

## 2013-03-07 MED ORDER — LACTATED RINGERS IV BOLUS (SEPSIS)
1000.0000 mL | Freq: Once | INTRAVENOUS | Status: AC
  Administered 2013-03-07: 1000 mL via INTRAVENOUS

## 2013-03-07 NOTE — MAU Note (Signed)
Tina Osborn 27 y.o.G2P1001 @[redacted]w[redacted]d  being observed for PT UCs.  Addendum to MAU note this date. SUBJECTIVE: Aware of irregular, nonpainful UCs. On IV #2. S/P Procardia x3 and BMZ . No LOF or VB. Good FM. Normal GTT.   OBJECTIVE: Filed Vitals:   03/07/13 0316 03/07/13 0535 03/07/13 0730 03/07/13 0925  BP: 131/80 136/68 113/67 101/55  Pulse: 102 118 98 104  Temp: 97.4 F (36.3 C)     TempSrc: Oral     Resp: 20 20  16   Height: 5\' 6"  (1.676 m)     Weight: 177 lb (80.287 kg)       Gen: NAD Abd: soft. NT, S=D SVE: posterior 1/thick/-3 breech EFM: low amplitude UCs 30 sec duration q 4-5 min FHR140's reactive  Bedside US by me: Breech, normal AFV, Gr 1 placenta  Results for orders placed during the hospital encounter of 03/07/13 (from the past 24 hour(s))  URINALYSIS, ROUTINE W REFLEX MICROSCOPIC     Status: Abnormal   Collection Time    03/07/13  3:00 AM      Result Value Range   Color, Urine YELLOW  YELLOW   APPearance CLEAR  CLEAR   Specific Gravity, Urine 1.020  1.005 - 1.030   pH 7.5  5.0 - 8.0   Glucose, UA NEGATIVE  NEGATIVE mg/dL   Hgb urine dipstick NEGATIVE  NEGATIVE   Bilirubin Urine NEGATIVE  NEGATIVE   Ketones, ur NEGATIVE  NEGATIVE mg/dL   Protein, ur NEGATIVE  NEGATIVE mg/dL   Urobilinogen, UA 0.2  0.0 - 1.0 mg/dL   Nitrite NEGATIVE  NEGATIVE   Leukocytes, UA SMALL (*) NEGATIVE  URINE MICROSCOPIC-ADD ON     Status: Abnormal   Collection Time    03/07/13  3:00 AM      Result Value Range   Squamous Epithelial / LPF FEW (*) RARE   WBC, UA 3-6  <3 WBC/hpf   RBC / HPF 0-2  <3 RBC/hpf   Bacteria, UA FEW (*) RARE   Urine-Other AMORPHOUS URATES/PHOSPHATES    FETAL FIBRONECTIN     Status: None   Collection Time    03/07/13  4:10 AM      Result Value Range   Fetal Fibronectin NEGATIVE  NEGATIVE  WET PREP, GENITAL     Status: Abnormal   Collection Time    03/07/13  4:10 AM      Result Value Range   Yeast Wet Prep HPF POC NONE SEEN  NONE SEEN   Trich,  Wet Prep NONE SEEN  NONE SEEN   Clue Cells Wet Prep HPF POC NONE SEEN  NONE SEEN   WBC, Wet Prep HPF POC FEW (*) NONE SEEN   ASSESSMENT:  G2P1001 at [redacted]w[redacted]d with PTUCs  PLAN: Discharge with F/U and BMZ #2 in am.    Medication List         calcium carbonate 500 MG chewable tablet  Commonly known as:  TUMS - dosed in mg elemental calcium  Chew 1 tablet by mouth daily as needed for heartburn.     NIFEdipine 10 MG capsule  Commonly known as:  PROCARDIA  Take 1 capsule (10 mg total) by mouth every 8 (eight) hours.     prenatal multivitamin Tabs tablet  Take 1 tablet by mouth daily at 12 noon.       Follow-up Information   Follow up with [facility]. Schedule an appointment as soon as possible for a visit in 1 day.   Contact  information:   [address] [place] [address] [phone]    Danae Orleans, CNM 03/07/2013 9:38 AM

## 2013-03-07 NOTE — MAU Note (Signed)
PO hydration 

## 2013-03-07 NOTE — MAU Note (Signed)
Pt having contractions since 0150.

## 2013-03-07 NOTE — MAU Provider Note (Signed)
  History     CSN: [id number]  Arrival date and time: 03/07/13 0246   None     Chief Complaint  Patient presents with  . Contractions   HPI  Tina Osborn is a 27 y.o. G2P1001 at [redacted]w[redacted]d who presents complaining of painful contractions. They began about 1:45 this morning and woke her up from sleep. Baby is moving well. No bleeding or fluid leaking. Has had some white discharge. No burning when she urinates.   OB History   Grav Para Term Preterm Abortions TAB SAB Ect Mult Living   2 1 1       1       Past Medical History  Diagnosis Date  . Medical history non-contributory     Past Surgical History  Procedure Laterality Date  . No past surgeries      Family History  Problem Relation Age of Onset  . Cancer Mother     ovarian  . Hypertension Father   . Cancer Father     skin  . Cancer Paternal Grandmother     breast    History  Substance Use Topics  . Smoking status: Never Smoker   . Smokeless tobacco: Not on file  . Alcohol Use: No    Allergies: No Known Allergies  Prescriptions prior to admission  Medication Sig Dispense Refill  . Prenatal Vit-Fe Fumarate-FA (PRENATAL MULTIVITAMIN) TABS Take 1 tablet by mouth daily at 12 noon.        ROS + contractions. Baby is moving well. No bleeding or fluid leaking. Has had some white discharge. No burning when she urinates.   Physical Exam   Blood pressure 131/80, pulse 102, temperature 97.4 F (36.3 C), temperature source Oral, resp. rate 20, height 5\' 6"  (1.676 m), weight 177 lb (80.287 kg), last menstrual period 07/16/2012.  Physical Exam Gen: NAD but appears uncomfortable with contractions Lungs: NWOB Abd: gravid but otherwise soft, nontender to palpation Neuro: grossly nonfocal, speech intact GU: normal appearing external genitalia Dilation: 1 Effacement (%): Thick Station: -2 Presentation: Vertex Exam by:: Rudi Coco RN   MAU Course  Procedures  MDM FFN: negative Wet prep: pending  FHR:  baseline 130, moderate variability, + accels, no decels Toco: regular ctx q 3-4 minutes   Assessment and Plan   Tina Osborn is a 27 y.o. G2P1001 at [redacted]w[redacted]d who presents with threatened preterm labor, painfully contracting. Cervix thus far is unchanged. Urine does not appear infected on UA. Will send FFN, wet prep, and GBS. Will dose procardia 10mg  q20 minutes x3 now to see if ctx abate.  6:30am update: ctx spaced out slightly on monitor but pt still having pain with ctx. Cervical exam unchanged. Spoke with Dr. Jolayne Panther who recommends IV fluids for several hours, then recheck cervix again. Will sign patient out to CNM Hollace Kinnier of the day team.  Latrelle Dodrill, MD  03/07/2013, 4:27 AM   I have seen and examined this patient and agree with above documentation in the resident's note.  Further management as per Caren Griffins.  FFN negative, cervix unchanged but still painfully contracting.  IV fluids now and plan for re-eval afterwards.   Rulon Abide, M.D. [facility] Fellow 03/08/2013 7:35 AM

## 2013-03-08 ENCOUNTER — Ambulatory Visit (INDEPENDENT_AMBULATORY_CARE_PROVIDER_SITE_OTHER): Payer: PRIVATE HEALTH INSURANCE | Admitting: Advanced Practice Midwife

## 2013-03-08 ENCOUNTER — Encounter: Payer: Self-pay | Admitting: Advanced Practice Midwife

## 2013-03-08 VITALS — BP 120/80 | Wt 175.0 lb

## 2013-03-08 DIAGNOSIS — R768 Other specified abnormal immunological findings in serum: Secondary | ICD-10-CM

## 2013-03-08 DIAGNOSIS — O09299 Supervision of pregnancy with other poor reproductive or obstetric history, unspecified trimester: Secondary | ICD-10-CM

## 2013-03-08 DIAGNOSIS — Z331 Pregnant state, incidental: Secondary | ICD-10-CM

## 2013-03-08 DIAGNOSIS — Z1389 Encounter for screening for other disorder: Secondary | ICD-10-CM

## 2013-03-08 DIAGNOSIS — O0992 Supervision of high risk pregnancy, unspecified, second trimester: Secondary | ICD-10-CM

## 2013-03-08 DIAGNOSIS — O4700 False labor before 37 completed weeks of gestation, unspecified trimester: Secondary | ICD-10-CM

## 2013-03-08 LAB — POCT URINALYSIS DIPSTICK
Glucose, UA: NEGATIVE
Nitrite, UA: NEGATIVE

## 2013-03-08 LAB — URINE CULTURE
Colony Count: NO GROWTH
Culture: NO GROWTH

## 2013-03-08 MED ORDER — BETAMETHASONE SOD PHOS & ACET 6 (3-3) MG/ML IJ SUSP
12.0000 mg | Freq: Once | INTRAMUSCULAR | Status: AC
  Administered 2013-03-08: 12 mg via INTRAMUSCULAR

## 2013-03-08 MED ORDER — ACYCLOVIR 400 MG PO TABS: 400.0000 mg | ORAL_TABLET | Freq: Three times a day (TID) | ORAL | Status: DC

## 2013-03-08 NOTE — Progress Notes (Signed)
Still having contractions. Taking procardia 10mg  QID.  Cx was 1/thick/breech and negative fFN.  May increase up to 120mg /day prn contractions.  Routine questions about pregnancy answered.  F/U in 2 weeks for LROB.

## 2013-03-08 NOTE — Progress Notes (Signed)
Went to [facility] yesterday morning. Having contractions. She told, having preterm labor. Given injection. Advise come to office for another injection today.Marland Kitchen

## 2013-03-08 NOTE — Addendum Note (Signed)
Addended by: Criss Alvine on: 03/08/2013 12:36 PM   Modules accepted: Orders

## 2013-03-08 NOTE — MAU Provider Note (Signed)
Attestation of Attending Supervision of Advanced Practitioner (CNM/NP): Evaluation and management procedures were performed by the Advanced Practitioner under my supervision and collaboration.  I have reviewed the Advanced Practitioner's note and chart, and I agree with the management and plan.  Alessandria Henken 03/08/2013 9:12 AM

## 2013-03-09 ENCOUNTER — Encounter: Payer: PRIVATE HEALTH INSURANCE | Admitting: Advanced Practice Midwife

## 2013-03-09 ENCOUNTER — Encounter: Payer: Self-pay | Admitting: Family Medicine

## 2013-03-09 LAB — CULTURE, BETA STREP (GROUP B ONLY)

## 2013-03-15 ENCOUNTER — Ambulatory Visit: Payer: PRIVATE HEALTH INSURANCE | Admitting: Obstetrics and Gynecology

## 2013-03-22 ENCOUNTER — Ambulatory Visit (INDEPENDENT_AMBULATORY_CARE_PROVIDER_SITE_OTHER): Payer: PRIVATE HEALTH INSURANCE | Admitting: Obstetrics & Gynecology

## 2013-03-22 ENCOUNTER — Encounter: Payer: Self-pay | Admitting: Obstetrics & Gynecology

## 2013-03-22 VITALS — BP 118/70 | Wt 181.0 lb

## 2013-03-22 DIAGNOSIS — Z331 Pregnant state, incidental: Secondary | ICD-10-CM

## 2013-03-22 DIAGNOSIS — Z1389 Encounter for screening for other disorder: Secondary | ICD-10-CM

## 2013-03-22 DIAGNOSIS — O09299 Supervision of pregnancy with other poor reproductive or obstetric history, unspecified trimester: Secondary | ICD-10-CM

## 2013-03-22 DIAGNOSIS — Z3483 Encounter for supervision of other normal pregnancy, third trimester: Secondary | ICD-10-CM

## 2013-03-22 LAB — POCT URINALYSIS DIPSTICK
Blood, UA: NEGATIVE
Glucose, UA: NEGATIVE
Ketones, UA: NEGATIVE

## 2013-03-22 NOTE — Patient Instructions (Signed)
Epidural Risks and Benefits The continuous putting in (infusion) of local anesthetics through a long, narrow, hollow plastic tube (catheter)/needle into the lower (lumbar) area of your spine is commonly called an epidural. This means outside the covering of the spinal cord. The epidural catheter is placed in the space on the outside of the membrane that covers the spinal cord. The anesthetic medicine numbs the nerves of the spinal cord in the epidural space. There is also a spinal/epidural anesthetic using two needles and a catheter. The medication is first placed in the spinal canal. Then that needle is removed and a catheter is placed in the epidural space through the second needle for continuous anesthesia. This seems to be the most popular type of regional anesthesia used now. This is sometimes given for pain management to women who are giving birth. Spinal and epidural anesthesia are called regional anesthesia because they numb a certain region of the body. While it is an effective pain management tool, some reasons not to use this include:  Restricted mobility: The tubes and monitors connected to you do not allow for much moving around.  Increased likelihood of bladder catheterization, oxytocin administration, and internal monitoring. This means a tube (catheter) may have to be put into the bladder to drain the urine. Uterine contractions can become weaker and less frequent. They also may have a higher use of oxytocin than mothers not having regional anesthesia.  Increased likelihood of operative delivery: This includes the use of or need for forceps, vacuum extractor, episiotomy, or cesarean delivery. When the dose is too large, or when it sinks down into the "tailbone" (sacral) region of the body, the perineum and the birth canal (vagina) are anesthetized. Anesthetic is injected into this area late in labor to deaden all sensation. When it "accidentally" happens earlier in labor, the muscles of the  pelvic floor are relaxed too early. This interferes with the normal flexion and rotation of the baby's head as it passes through the birth canal. This interference can lead to abnormal presentations that are more dangerous for the baby.  Must use an automatic blood pressure cuff throughout labor. This is a cuff that automatically takes your blood pressure at regular intervals. SHORT TERM MATERNAL RISKS  Dural puncture - The dura is one of the membranes surrounding the spinal cord. If the anesthetic medication gets into the spinal canal through a dural puncture, it can result in a spinal anesthetic and spinal headache. Spinal headaches are treated with an epidural blood patch to cover the punctured area.  Low blood pressure (hypotension) - Nearly one third of women with an epidural will develop low blood pressure. The ways that patients must lay during the epidural can make this worse. Their position is limited because they will be unable to move their legs easily for the time of the anesthetic. Low blood pressure is also a risk for the baby. If the baby does not get enough oxygen from the mom's blood, it can result in an emergency Cesarean section. This means the baby is delivered by an operation through a cut by the surgeon (incision) on the belly of the mother.  Nausea, vomiting, and prolonged shivering.  Prolonged labor - With large doses of anesthetic medication, the patient loses the desire and the ability to bear down and push. This results in an increased use of forceps and vacuum extractions, compared to women having unmedicated deliveries.  Uneven, incomplete or non-existent pain relief. Sometimes the epidural does not work well and   additional medications may be needed for pain relief.  Difficulty breathing well or paralysis if the level of anesthesia goes too high in the spine.  Convulsions - If the anesthetic agent accidentally is injected into a blood vessel it can cause convulsions and  loss of consciousness.  Toxic drug reactions.  Septic meningitis - An abscess can form at the site where the epidural catheter is placed. If this spreads into the spinal canal it can cause meningitis.  Allergic reaction - This causes blood pressure to become too low and other medications and fluids must be given to bring the blood pressure up. Also rashes and difficulty breathing may develop.  Cardiac arrest - This is rare but real threat to the life of the mother and baby.  Fever is common.  Itching that is easily treated.  Spinal hematoma. LONG TERM MATERNAL RISKS  Neurological complications - A nerve problem called Horner's syndrome can develop with epidural anesthesia for vaginal delivery. It is impossible to predict which patients will develop a Horner's syndrome. Even the nerves to the face can be blocked, temporarily or permanently. Tremors and shakes can occur.  Paresthesia ("pins and needles"). This is a feeling that comes from inflammation of a nerve.  Dizziness and fainting can become a problem after epidurals. This is usually only for a couple of days. RISKS TO BABY  Direct drug toxicity.  Fetal distress, abnormal fetal heart rate (FHR) (can lead to emergency cesarean). This is especially true if the anesthetic gets into the mother's blood stream or too much medication is put into the epidural. REASONS NOT TO HAVE EPIDURAL ANESTHESIA  Increased costs.  The mother has a low blood pressure.  There are blood clotting problems.  A brain tumor is present.  There is an infection in the blood stream.  A skin infection at the needle site.  A tattoo at the needle site. BENEFITS  Regional anesthesia is the most effective pain relief for labor and delivery.  It is the best anesthetic for preeclampsia and eclampsia.  There is better pain control after delivery (vaginal or cesarean).  When done correctly, no medication gets to the baby.  Sooner ambulation after  delivery.  It can be left in place during all of labor.  You can be awake during a Cesarean delivery and see the baby immediately after delivery. AFTER THE PROCEDURE   You will be kept in bed for several hours to prevent headaches.  You will be kept in bed until your legs are no longer numb and it is safe to walk.  The length of time you spend in the hospital will depend on the type of surgery or procedure you have had.  The epidural catheter is removed after you no longer need it for pain. HOME CARE INSTRUCTIONS   Do not drive or operate any kind of machinery for at least 24 hours. Make sure there is someone to drive you home.  Do not drink alcohol for at least 24 hours after the anesthesia.  Do not make important decisions for at least 24 hours after the anesthesia.  Drink lots of fluids.  Return to your normal diet.  Keep all your postoperative appointments as scheduled. SEEK IMMEDIATE MEDICAL CARE IF:  You develop a fever or temperature over 98.6 F (37 C).  You have a persistent headache.  You develop dizziness, fainting or lightheadedness.  You develop weakness, numbness or tingling in your arms or legs.  You have a skin rash.  You   have difficulty breathing  You have a stiff neck with or without stiff back.  You develop chest pain. Document Released: 07/06/2005 Document Revised: 09/28/2011 Document Reviewed: 08/13/2008 [employer] Patient Information 2014 [employer].  

## 2013-03-22 NOTE — Progress Notes (Signed)
BP weight and urine results all reviewed and noted. Patient reports good fetal movement, denies any bleeding and no rupture of membranes symptoms or regular contractions. Patient is without complaints. All questions were answered.  

## 2013-03-25 ENCOUNTER — Other Ambulatory Visit (HOSPITAL_COMMUNITY): Payer: Self-pay | Admitting: Obstetrics and Gynecology

## 2013-03-29 ENCOUNTER — Encounter: Payer: Self-pay | Admitting: Advanced Practice Midwife

## 2013-03-29 ENCOUNTER — Ambulatory Visit (INDEPENDENT_AMBULATORY_CARE_PROVIDER_SITE_OTHER): Payer: PRIVATE HEALTH INSURANCE | Admitting: Advanced Practice Midwife

## 2013-03-29 VITALS — BP 118/70 | Wt 183.6 lb

## 2013-03-29 DIAGNOSIS — O09299 Supervision of pregnancy with other poor reproductive or obstetric history, unspecified trimester: Secondary | ICD-10-CM

## 2013-03-29 DIAGNOSIS — Z331 Pregnant state, incidental: Secondary | ICD-10-CM

## 2013-03-29 DIAGNOSIS — Z1389 Encounter for screening for other disorder: Secondary | ICD-10-CM

## 2013-03-29 DIAGNOSIS — O98519 Other viral diseases complicating pregnancy, unspecified trimester: Secondary | ICD-10-CM

## 2013-03-29 LAB — POCT URINALYSIS DIPSTICK: Ketones, UA: NEGATIVE

## 2013-03-29 MED ORDER — OMEPRAZOLE 20 MG PO TBEC: 1.0000 | DELAYED_RELEASE_TABLET | Freq: Once | ORAL | Status: DC

## 2013-03-29 NOTE — Patient Instructions (Signed)
Check in to Maternity Admissions Unit at 7 am on Saturday, 04/01/13 for an External Cephalic Version!  Good luck1!External Cephalic Version External cephalic version is turning a baby that is presenting their buttocks first (breech) or is lying sideways in the uterus (transverse) to a head-first position. This makes the labor and delivery faster, safer for the mother and baby, and lessens the chance for a Cesarean section. It should not be tried until the pregnancy is [redacted] weeks along or longer. BEFORE THE PROCEDURE   Do not take aspirin.  Do not eat for 4 hours before the procedure.  Tell your caregiver if you have a cold, fever or an infection.  Tell your caregiver if you are having contractions.  Tell your caregiver if you are leaking or had a gush of fluid from your vagina.  Tell your caregiver if you have any vaginal bleeding or abnormal discharge.  If you are being admitted the same day, arrive at the hospital at least one hour before the procedure to sign any necessary documents and to get prepared for the procedure.  Tell your caregiver if you had any problems with anesthetics in the past.  Tell your caregiver if you are taking any medications that your caregiver does not know about. This includes over-the-counter and prescription drugs, herbs, eye drops and creams. PROCEDURE  First, an ultrasound is done to make sure the baby is breech or transverse.  A non-stress test or biophysical profile is done on the baby before the ECV. This is done to make sure it is safe for the baby to have the ECV. It may also be done after the procedure to make sure the baby is OK.  ECV is done in the delivery/surgical room with an anesthesiologist present. There should be a setup for an emergency Cesarean section with a full nursing and nursery staff available and ready.  The patient may be given a medication to relax the uterine muscles. An epidural may be given for any discomfort. It is helpful for  the success of the ECV.  An electronic fetal monitor is placed on the uterus during the procedure to make sure the baby is OK.  If the mother is Rh negative, Rho-gam will be given to her to prevent Rh problems for future pregnancies.  The mother is followed closely for 2 to 3 hours after the procedure to make sure no problems develop. BENEFITS OF ECV  Easier and safer labor and delivery for the mother and baby.  Lower incidence of Cesarean section.  Lower costs with a vaginal delivery. RISKS OF ECV  The placenta pulls away from the wall of the uterus before delivery (abruption of the placenta).  Rupture of the uterus, especially in patients with a previous Cesarean section.  Fetal distress.  Early (premature) labor.  Premature rupture of the membranes.  The baby will return to the breech or transverse lie position.  Death of the fetus can happen, but is very rare. ECV SHOULD BE STOPPED IF:  The fetal heart tones drop.  The mother is having a lot of pain.  You cannot turn the baby after several attempts. ECV SHOULD NOT BE DONE IF:  The non-stress test or biophysical profile is abnormal.  There is vaginal bleeding.  An abnormal shaped uterus is present.  There is heart disease or uncontrolled high blood pressure in the mother.  There are twins or more.  The placenta covers the opening of the cervix (placenta previa).  You had a  previous cesarean section with a classical incision or major surgery of the uterus.  There is not enough amniotic fluid in the sac (oligohydramnios).  The baby is too small for the pregnancy or has not developed normally (anomaly).  Your membranes have ruptured. HOME CARE INSTRUCTIONS   Have someone take you home after the procedure.  Rest at home for several hours.  Have someone stay with you for a few hours after you get home.  After ECV, continue with your prenatal visits as directed.  Continue your regular diet, rest and  activities.  Do not do any strenuous activities for a couple of days. SEEK IMMEDIATE MEDICAL CARE IF:   You develop vaginal bleeding.  You have fluid coming out of your vagina (bag of water may have broken).  You develop uterine contractions.  You do not feel the baby move or there is less movement of the baby.  You develop abdominal pain.  You develop an oral temperature of 102 F (38.9 C) or higher. Document Released: 12/29/2006 Document Revised: 09/28/2011 Document Reviewed: 10/24/2008 [facility] Patient Information 2014 [place], [place].

## 2013-03-29 NOTE — Progress Notes (Signed)
Less frequent contractions with the medication. The more tired she feels and more she does during the day the more contractions she feels. There has been a slight whitish discharge that is not mucoid in nature. She is complaining of significant GERD that is not alleviated by dietary changes and sleeping propped up in bed. rx omeprezole.20mg   She is still concerned that her baby is in the breech position. Bedside u/s shows vtx.  Baby has flipped several times  Encouraged mat belt.

## 2013-04-01 ENCOUNTER — Inpatient Hospital Stay (HOSPITAL_COMMUNITY): Admission: RE | Admit: 2013-04-01 | Payer: PRIVATE HEALTH INSURANCE | Source: Ambulatory Visit

## 2013-04-05 ENCOUNTER — Encounter: Payer: Self-pay | Admitting: Obstetrics & Gynecology

## 2013-04-05 ENCOUNTER — Ambulatory Visit (INDEPENDENT_AMBULATORY_CARE_PROVIDER_SITE_OTHER): Payer: PRIVATE HEALTH INSURANCE | Admitting: Obstetrics & Gynecology

## 2013-04-05 VITALS — BP 120/70 | Wt 185.5 lb

## 2013-04-05 DIAGNOSIS — O09299 Supervision of pregnancy with other poor reproductive or obstetric history, unspecified trimester: Secondary | ICD-10-CM

## 2013-04-05 DIAGNOSIS — Z1389 Encounter for screening for other disorder: Secondary | ICD-10-CM

## 2013-04-05 DIAGNOSIS — Z3483 Encounter for supervision of other normal pregnancy, third trimester: Secondary | ICD-10-CM

## 2013-04-05 DIAGNOSIS — Z331 Pregnant state, incidental: Secondary | ICD-10-CM

## 2013-04-05 DIAGNOSIS — O98519 Other viral diseases complicating pregnancy, unspecified trimester: Secondary | ICD-10-CM

## 2013-04-05 LAB — POCT URINALYSIS DIPSTICK
Glucose, UA: NEGATIVE
Ketones, UA: NEGATIVE
Nitrite, UA: NEGATIVE

## 2013-04-05 NOTE — Progress Notes (Signed)
BP weight and urine results all reviewed and noted. Patient reports good fetal movement, denies any bleeding and no rupture of membranes symptoms or regular contractions. Patient is without complaints. All questions were answered.  

## 2013-04-05 NOTE — Addendum Note (Signed)
Addended by: Colen Darling on: 04/05/2013 04:52 PM   Modules accepted: Orders

## 2013-04-07 LAB — STREP B DNA PROBE: GBSP: NEGATIVE

## 2013-04-12 ENCOUNTER — Ambulatory Visit (INDEPENDENT_AMBULATORY_CARE_PROVIDER_SITE_OTHER): Payer: PRIVATE HEALTH INSURANCE | Admitting: Obstetrics & Gynecology

## 2013-04-12 ENCOUNTER — Encounter: Payer: Self-pay | Admitting: Obstetrics & Gynecology

## 2013-04-12 VITALS — BP 110/70 | Wt 187.0 lb

## 2013-04-12 DIAGNOSIS — Z331 Pregnant state, incidental: Secondary | ICD-10-CM

## 2013-04-12 DIAGNOSIS — O09299 Supervision of pregnancy with other poor reproductive or obstetric history, unspecified trimester: Secondary | ICD-10-CM

## 2013-04-12 DIAGNOSIS — IMO0002 Reserved for concepts with insufficient information to code with codable children: Secondary | ICD-10-CM

## 2013-04-12 DIAGNOSIS — O98519 Other viral diseases complicating pregnancy, unspecified trimester: Secondary | ICD-10-CM

## 2013-04-12 DIAGNOSIS — Z1389 Encounter for screening for other disorder: Secondary | ICD-10-CM

## 2013-04-12 LAB — POCT URINALYSIS DIPSTICK
Leukocytes, UA: NEGATIVE
Nitrite, UA: NEGATIVE
Protein, UA: NEGATIVE

## 2013-04-12 NOTE — Progress Notes (Signed)
BP weight and urine results all reviewed and noted. Patient reports good fetal movement, denies any bleeding and no rupture of membranes symptoms or regular contractions. Patient is without complaints. All questions were answered.  

## 2013-04-15 ENCOUNTER — Inpatient Hospital Stay (HOSPITAL_COMMUNITY)
Admission: AD | Admit: 2013-04-15 | Discharge: 2013-04-16 | DRG: 774 | Disposition: A | Discharge status: Home / Self Care | Payer: PRIVATE HEALTH INSURANCE | Source: Ambulatory Visit | Attending: Obstetrics & Gynecology | Admitting: Obstetrics & Gynecology

## 2013-04-15 ENCOUNTER — Encounter (HOSPITAL_COMMUNITY): Payer: Self-pay | Admitting: *Deleted

## 2013-04-15 DIAGNOSIS — O479 False labor, unspecified: Secondary | ICD-10-CM | POA: Diagnosis present

## 2013-04-15 DIAGNOSIS — A6000 Herpesviral infection of urogenital system, unspecified: Secondary | ICD-10-CM

## 2013-04-15 DIAGNOSIS — IMO0002 Reserved for concepts with insufficient information to code with codable children: Secondary | ICD-10-CM

## 2013-04-15 DIAGNOSIS — O98519 Other viral diseases complicating pregnancy, unspecified trimester: Secondary | ICD-10-CM

## 2013-04-15 DIAGNOSIS — O0992 Supervision of high risk pregnancy, unspecified, second trimester: Secondary | ICD-10-CM

## 2013-04-15 LAB — CBC
HCT: 34 % — ABNORMAL LOW (ref 36.0–46.0)
MCHC: 34.1 g/dL (ref 30.0–36.0)
MCV: 85 fL (ref 78.0–100.0)
RDW: 12.8 % (ref 11.5–15.5)

## 2013-04-15 MED ORDER — LACTATED RINGERS IV SOLN: INTRAVENOUS | Status: DC

## 2013-04-15 MED ORDER — LACTATED RINGERS IV SOLN: 500.0000 mL | INTRAVENOUS | Status: DC | PRN

## 2013-04-15 MED ORDER — SENNOSIDES-DOCUSATE SODIUM 8.6-50 MG PO TABS: 2.0000 | ORAL_TABLET | Freq: Every day | ORAL | Status: DC

## 2013-04-15 MED ORDER — DIBUCAINE 1 % RE OINT: 1.0000 "application " | TOPICAL_OINTMENT | RECTAL | Status: DC | PRN

## 2013-04-15 MED ORDER — OXYCODONE-ACETAMINOPHEN 5-325 MG PO TABS: 1.0000 | ORAL_TABLET | ORAL | Status: DC | PRN

## 2013-04-15 MED ORDER — SODIUM CHLORIDE 0.9 % IJ SOLN
3.0000 mL | Freq: Two times a day (BID) | INTRAMUSCULAR | Status: DC
  Administered 2013-04-15: 3 mL via INTRAVENOUS

## 2013-04-15 MED ORDER — ONDANSETRON HCL 4 MG/2ML IJ SOLN: 4.0000 mg | Freq: Four times a day (QID) | INTRAMUSCULAR | Status: DC | PRN

## 2013-04-15 MED ORDER — SIMETHICONE 80 MG PO CHEW: 80.0000 mg | CHEWABLE_TABLET | ORAL | Status: DC | PRN

## 2013-04-15 MED ORDER — OXYTOCIN 40 UNITS IN LACTATED RINGERS INFUSION - SIMPLE MED
62.5000 mL/h | INTRAVENOUS | Status: DC
  Administered 2013-04-15: 999 mL/h via INTRAVENOUS

## 2013-04-15 MED ORDER — DIPHENHYDRAMINE HCL 25 MG PO CAPS: 25.0000 mg | ORAL_CAPSULE | Freq: Four times a day (QID) | ORAL | Status: DC | PRN

## 2013-04-15 MED ORDER — PRENATAL MULTIVITAMIN CH
1.0000 | ORAL_TABLET | Freq: Every day | ORAL | Status: DC
  Administered 2013-04-15 – 2013-04-16 (×2): 1 via ORAL
  Filled 2013-04-15 (×2): qty 1

## 2013-04-15 MED ORDER — IBUPROFEN 600 MG PO TABS
600.0000 mg | ORAL_TABLET | Freq: Four times a day (QID) | ORAL | Status: DC
  Administered 2013-04-15 – 2013-04-16 (×5): 600 mg via ORAL
  Filled 2013-04-15 (×5): qty 1

## 2013-04-15 MED ORDER — ONDANSETRON HCL 4 MG PO TABS: 4.0000 mg | ORAL_TABLET | ORAL | Status: DC | PRN

## 2013-04-15 MED ORDER — WITCH HAZEL-GLYCERIN EX PADS: 1.0000 "application " | MEDICATED_PAD | CUTANEOUS | Status: DC | PRN

## 2013-04-15 MED ORDER — LIDOCAINE HCL (PF) 1 % IJ SOLN
30.0000 mL | INTRAMUSCULAR | Status: DC | PRN
  Administered 2013-04-15: 30 mL via SUBCUTANEOUS
  Filled 2013-04-15: qty 30

## 2013-04-15 MED ORDER — BISACODYL 10 MG RE SUPP: 10.0000 mg | Freq: Every day | RECTAL | Status: DC | PRN

## 2013-04-15 MED ORDER — ZOLPIDEM TARTRATE 5 MG PO TABS: 5.0000 mg | ORAL_TABLET | Freq: Every evening | ORAL | Status: DC | PRN

## 2013-04-15 MED ORDER — OXYTOCIN 40 UNITS IN LACTATED RINGERS INFUSION - SIMPLE MED: 62.5000 mL/h | INTRAVENOUS | Status: DC | PRN

## 2013-04-15 MED ORDER — CITRIC ACID-SODIUM CITRATE 334-500 MG/5ML PO SOLN: 30.0000 mL | ORAL | Status: DC | PRN

## 2013-04-15 MED ORDER — IBUPROFEN 600 MG PO TABS
600.0000 mg | ORAL_TABLET | Freq: Four times a day (QID) | ORAL | Status: DC | PRN
  Filled 2013-04-15: qty 1

## 2013-04-15 MED ORDER — OXYTOCIN BOLUS FROM INFUSION: 500.0000 mL | INTRAVENOUS | Status: DC

## 2013-04-15 MED ORDER — BENZOCAINE-MENTHOL 20-0.5 % EX AERO
1.0000 "application " | INHALATION_SPRAY | CUTANEOUS | Status: DC | PRN
  Administered 2013-04-15: 1 via TOPICAL
  Filled 2013-04-15: qty 56

## 2013-04-15 MED ORDER — OXYTOCIN 40 UNITS IN LACTATED RINGERS INFUSION - SIMPLE MED
62.5000 mL/h | INTRAVENOUS | Status: DC
  Filled 2013-04-15: qty 1000

## 2013-04-15 MED ORDER — ONDANSETRON HCL 4 MG/2ML IJ SOLN: 4.0000 mg | INTRAMUSCULAR | Status: DC | PRN

## 2013-04-15 MED ORDER — FENTANYL CITRATE 0.05 MG/ML IJ SOLN
100.0000 ug | INTRAMUSCULAR | Status: DC | PRN
  Administered 2013-04-15 (×2): 100 ug via INTRAVENOUS
  Filled 2013-04-15 (×2): qty 2

## 2013-04-15 MED ORDER — FLEET ENEMA 7-19 GM/118ML RE ENEM: 1.0000 | ENEMA | RECTAL | Status: DC | PRN

## 2013-04-15 MED ORDER — IBUPROFEN 600 MG PO TABS
600.0000 mg | ORAL_TABLET | Freq: Four times a day (QID) | ORAL | Status: DC | PRN
  Administered 2013-04-15: 600 mg via ORAL

## 2013-04-15 MED ORDER — SODIUM CHLORIDE 0.9 % IV SOLN: 250.0000 mL | INTRAVENOUS | Status: DC | PRN

## 2013-04-15 MED ORDER — LIDOCAINE HCL (PF) 1 % IJ SOLN
30.0000 mL | INTRAMUSCULAR | Status: DC | PRN
  Filled 2013-04-15 (×2): qty 30

## 2013-04-15 MED ORDER — ACETAMINOPHEN 325 MG PO TABS: 650.0000 mg | ORAL_TABLET | ORAL | Status: DC | PRN

## 2013-04-15 MED ORDER — FLEET ENEMA 7-19 GM/118ML RE ENEM: 1.0000 | ENEMA | Freq: Every day | RECTAL | Status: DC | PRN

## 2013-04-15 MED ORDER — MEASLES, MUMPS & RUBELLA VAC ~~LOC~~ INJ: 0.5000 mL | INJECTION | Freq: Once | SUBCUTANEOUS | Status: DC

## 2013-04-15 MED ORDER — LANOLIN HYDROUS EX OINT: TOPICAL_OINTMENT | CUTANEOUS | Status: DC | PRN

## 2013-04-15 MED ORDER — SODIUM CHLORIDE 0.9 % IJ SOLN: 3.0000 mL | INTRAMUSCULAR | Status: DC | PRN

## 2013-04-15 MED ORDER — LACTATED RINGERS IV SOLN
INTRAVENOUS | Status: DC
  Administered 2013-04-15: 05:00:00 via INTRAVENOUS

## 2013-04-15 MED ORDER — TETANUS-DIPHTH-ACELL PERTUSSIS 5-2.5-18.5 LF-MCG/0.5 IM SUSP: 0.5000 mL | Freq: Once | INTRAMUSCULAR | Status: DC

## 2013-04-15 MED ORDER — SODIUM CHLORIDE 0.9 % IV SOLN: 10.0000 ug/h | INTRAVENOUS | Status: DC

## 2013-04-15 NOTE — Lactation Note (Signed)
This note was copied from the chart of Tina Osborn. Lactation Consultation Note  Patient Name: Tina Osborn [id number] Date: 04/15/2013 Reason for consult: Initial assessment of this second-time mother and her newborn at 65 hours of age and already breastfeeding well, with initial LATCh score=8 and most recent feeding=9, per RN assessment.  Mom states she breastfed her first for 2 months but states this baby is nursing better than first and has been feeding frequently "on cue".  Mom denies any concerns at this time and feels like she "has more milk this time."  Baby has fed seven times for 5-40 minutes per feeding and has already had first void and first stool.   Maternal Data Formula Feeding for Exclusion: No Infant to breast within first hour of birth: Yes (initial LATCH score=8) Has patient been taught Hand Expression?: Yes (experienced mom; states she has more milk this time) Does the patient have breastfeeding experience prior to this delivery?: Yes  Feeding Feeding Type: Breast Milk Length of feed: 20 min  LATCH Score/Interventions         Most recent LATCH score=9             Lactation Tools Discussed/Used   Cue feedings [facility] LC services (mom awake but room dark and "trying to rest" so LC said will re-visit tomorrow  Consult Status Consult Status: Follow-up Date: 04/16/13 Follow-up type: In-patient    Warrick Parisian [facility] 04/15/2013, 10:19 PM

## 2013-04-15 NOTE — H&P (Signed)
Tina Osborn is a 27 y.o. female G2P1001 with IUP at [redacted]w[redacted]d presenting for regular contractions.  States has been having worsening strong contractions for the last several hours. No LOF. No VB.  +FM.      PNCare at [facility] since 11 wks.  Has had an uncomplicated pregnancy. Declined genetic screen. All other testing was normal. GBS neg. HSV + but never had an outbreak and has been on suppression without missing any doses.   Prenatal History/Complications:  Past Medical History: Past Medical History  Diagnosis Date  . Medical history non-contributory   . HSV-2 seropositive 2014    Past Surgical History: Past Surgical History  Procedure Laterality Date  . No past surgeries      Obstetrical History: OB History   Grav Para Term Preterm Abortions TAB SAB Ect Mult Living   2 1 1       1      G1- NSVD, 8lb 12oz    Social History: History   Social History  . Marital Status: Married    Spouse Name: N/A    Number of Children: N/A  . Years of Education: N/A   Social History Main Topics  . Smoking status: Never Smoker   . Smokeless tobacco: Never Used  . Alcohol Use: No  . Drug Use: No  . Sexual Activity: Not Currently    Birth Control/ Protection: None   Other Topics Concern  . None   Social History Narrative  . None    Family History: Family History  Problem Relation Age of Onset  . Cancer Mother     ovarian  . Hypertension Father   . Cancer Father     skin  . Cancer Paternal Grandmother     breast    Allergies: No Known Allergies  Prescriptions prior to admission  Medication Sig Dispense Refill  . acyclovir (ZOVIRAX) 400 MG tablet Take 1 tablet (400 mg total) by mouth 3 (three) times daily.  90 tablet  1  . calcium carbonate (TUMS - DOSED IN MG ELEMENTAL CALCIUM) 500 MG chewable tablet Chew 1 tablet by mouth daily as needed for heartburn.      . Omeprazole ([employer] OMEPRAZOLE) 20 MG TBEC Take 1 tablet (20 mg total) by mouth once.  30 each  2  . Prenatal  Vit-Fe Fumarate-FA (PRENATAL MULTIVITAMIN) TABS Take 1 tablet by mouth daily at 12 noon.      . Calcium Carbonate Antacid (ALKA-SELTZER ANTACID PO) Take by mouth as needed.      Marland Kitchen NIFEdipine (PROCARDIA) 10 MG capsule TAKE 1 CAPSULE BY MOUTH EVERY 8 HOURS  60 capsule  0     Review of Systems   Constitutional: Negative for fever, chills, weight loss, malaise/fatigue and diaphoresis.  HENT: Negative for hearing loss, ear pain, nosebleeds, congestion, sore throat, neck pain, tinnitus and ear discharge.   Eyes: Negative for blurred vision, double vision, photophobia, pain, discharge and redness.  Respiratory: Negative for cough, hemoptysis, sputum production, shortness of breath, wheezing and stridor.   Cardiovascular: Negative for chest pain, palpitations, orthopnea,  leg swelling  Gastrointestinal: Positive for abdominal pain. Negative for heartburn, nausea, vomiting, diarrhea, constipation, blood in stool Genitourinary: Negative for dysuria, urgency, frequency, hematuria and flank pain.  Musculoskeletal: Negative for myalgias, back pain, joint pain and falls.  Skin: Negative for itching and rash.  Neurological: Negative for dizziness, tingling, tremors, sensory change, speech change, focal weakness, seizures, loss of consciousness, weakness and headaches.  Endo/Heme/Allergies: Negative for environmental allergies and polydipsia.  Does not bruise/bleed easily.  Psychiatric/Behavioral: Negative for depression, suicidal ideas, hallucinations, memory loss and substance abuse. The patient is not nervous/anxious and does not have insomnia.       Blood pressure 127/66, pulse 118, temperature 98.4 F (36.9 C), temperature source Oral, resp. rate 20, height 5\' 6"  (1.676 m), weight 84.641 kg (186 lb 9.6 oz), last menstrual period 07/16/2012. General appearance: alert, cooperative and uncomfortable Lungs: clear to auscultation bilaterally Heart: regular rate and rhythm Abdomen: soft, non-tender; bowel  sounds normal Pelvic: 6.5/90/-1. VTX. No HSV lesions noted externally Extremities: Homans sign is negative, no sign of DVT Presentation: cephalic Fetal monitoringBaseline: 130 bpm, Variability: Good {> 6 bpm), Accelerations: Reactive and Decelerations: Absent Uterine activityevery 1-4 min   Dilation: 6.5 Effacement (%): 90 Station: -1 Exam by:: V.Mensah   Prenatal labs: ABO, Rh: B/Positive/-- (02/19 0000) Antibody: NEG (07/10 0928) Rubella:   RPR: NON REAC (07/10 0928)  HBsAg: Negative (02/19 0000)  HIV: NON REACTIVE (07/10 0928)  GBS: NEGATIVE (09/17 1653)  1 hr Glucola normal Genetic screening  declined Anatomy US normal    Assessment: Tina Osborn is a 27 y.o. G2P1001 with an IUP at [redacted]w[redacted]d presenting for active labor.   Plan: 1) labor - admit to L&D  - routine orders - fentanyl for pain. Pt does not want an epidural  2) FWB - cat I tracing - GBS neg - HSV on suppression - EFW 8.5lb  3) anticipate SVD     Meris Reede L, MD 04/15/2013, 7:30 AM

## 2013-04-15 NOTE — H&P (Signed)
Attestation of Attending Supervision of Advanced Practitioner (CNM/NP): Evaluation and management procedures were performed by the Advanced Practitioner under my supervision and collaboration.  I have reviewed the Advanced Practitioner's note and chart, and I agree with the management and plan.  HARRAWAY-SMITH, Jasneet Schobert 8:27 AM

## 2013-04-15 NOTE — Progress Notes (Signed)
To BS via w/c °

## 2013-04-15 NOTE — MAU Note (Signed)
Contractions since 2030. Stronger since 0300. Denies bleeding or leaking fld

## 2013-04-15 NOTE — Progress Notes (Signed)
Dr Reola Calkins notified of pt's admisson and status.Will admit to BS. Aware of hx HSV but no signs outbreak.

## 2013-04-15 NOTE — Progress Notes (Signed)
Daelynn Blower is a 27 y.o. G2P1001 at [redacted]w[redacted]d admitted for active labor  Subjective: Breathing well w/ uc's, feeling a lot of pressure  Objective: BP 122/54  Pulse 113  Temp(Src) 98.4 F (36.9 C) (Oral)  Resp 18  Ht 5\' 6"  (1.676 m)  Wt 84.641 kg (186 lb 9.6 oz)  BMI 30.13 kg/m2  LMP 07/16/2012      FHT:  FHR: 125 bpm, variability: moderate,  accelerations:  Present,  decelerations:  Absent UC:   regular, every 1-3 minutes SVE:  8/100/0, BBOW AROM mod amount clear fluid  Labs: Lab Results  Component Value Date   WBC 12.7* 04/15/2013   HGB 11.6* 04/15/2013   HCT 34.0* 04/15/2013   MCV 85.0 04/15/2013   PLT 141* 04/15/2013    Assessment / Plan: Spontaneous labor, progressing normally, now arom'd  Labor: Progressing normally Preeclampsia:  n/a Fetal Wellbeing:  Category I Pain Control:  Labor support without medications I/D:  n/a Anticipated MOD:  NSVD  Marge Duncans 04/15/2013, 9:45 AM

## 2013-04-15 NOTE — MAU Note (Signed)
Report called to Dana RN in Bs.  

## 2013-04-16 ENCOUNTER — Encounter (HOSPITAL_COMMUNITY): Payer: Self-pay | Admitting: *Deleted

## 2013-04-16 MED ORDER — IBUPROFEN 600 MG PO TABS: 600.0000 mg | ORAL_TABLET | Freq: Four times a day (QID) | ORAL | Status: DC | PRN

## 2013-04-16 NOTE — Discharge Summary (Signed)
Attestation of Attending Supervision of Advanced Practitioner (CNM/NP): Evaluation and management procedures were performed by the Advanced Practitioner under my supervision and collaboration. I have reviewed the Advanced Practitioner's note and chart, and I agree with the management and plan.  Odis Wickey H. 7:41 AM   

## 2013-04-16 NOTE — Discharge Summary (Signed)
Obstetric Discharge Summary Reason for Admission: onset of labor Prenatal Procedures: ultrasound Intrapartum Procedures: spontaneous vaginal delivery Postpartum Procedures: none Complications-Operative and Postpartum: 1st degree sulcus and perineal Eating, drinking, voiding, ambulating well.  +flatus.  Lochia and pain wnl.  Denies dizziness, lightheadedness, or sob. No complaints. Desires early d/c  Hemoglobin  Date Value Range Status  04/15/2013 11.6* 12.0 - 15.0 g/dL Final  1/61/0960 45.4   Final     HCT  Date Value Range Status  04/15/2013 34.0* 36.0 - 46.0 % Final  09/07/2012 39   Final    Physical Exam:  General: alert, cooperative and no distress Lochia: appropriate Uterine Fundus: firm Incision: n/a DVT Evaluation: No evidence of DVT seen on physical exam. Negative Homan's sign. No cords or calf tenderness. No significant calf/ankle edema.  Discharge Diagnoses: Term Pregnancy-delivered  Discharge Information: Date: 04/16/2013 Activity: pelvic rest Diet: routine Medications: PNV and Ibuprofen Condition: stable Instructions: refer to practice specific booklet Discharge to: home Follow-up Information   Follow up with [facility]. Schedule an appointment as soon as possible for a visit in 6 weeks. (for your postpartum visit)    Specialty:  Obstetrics and Gynecology   Contact information:   [address] [place] [address] [phone]      Newborn Data: Live born female  Birth Weight: 8 lb 10 oz (3912 g) APGAR: 9, 9  Home with mother. Breastfeeding well, undecided about contraception- contemplating BTL.  No sex until after pp visit Wants IP circumcision before d/c  Marge Duncans 04/16/2013, 7:31 AM

## 2013-04-16 NOTE — Lactation Note (Signed)
This note was copied from the chart of Tina Osborn. Lactation Consultation Note Mom preparing for discharge, requested 2nd pair of comfort gels, LC gave to RN to give to mom. Mom had early lc visit today.  Patient Name: Tina Ranae Casebier [id number] Date: 04/16/2013     Maternal Data    Feeding    LATCH Score/Interventions                      Lactation Tools Discussed/Used     Consult Status  Mom to call as needed.    Jannifer Rodney 04/16/2013, 8:15 PM

## 2013-04-19 ENCOUNTER — Encounter: Payer: PRIVATE HEALTH INSURANCE | Admitting: Obstetrics & Gynecology

## 2013-04-20 ENCOUNTER — Encounter: Payer: PRIVATE HEALTH INSURANCE | Admitting: Obstetrics & Gynecology

## 2013-04-21 ENCOUNTER — Encounter: Payer: Self-pay | Admitting: Obstetrics & Gynecology

## 2013-04-21 ENCOUNTER — Ambulatory Visit (INDEPENDENT_AMBULATORY_CARE_PROVIDER_SITE_OTHER): Payer: PRIVATE HEALTH INSURANCE | Admitting: Obstetrics & Gynecology

## 2013-04-21 VITALS — BP 110/80 | Temp 99.1°F | Ht 66.0 in | Wt 169.5 lb

## 2013-04-21 DIAGNOSIS — O9122 Nonpurulent mastitis associated with the puerperium: Secondary | ICD-10-CM

## 2013-04-21 DIAGNOSIS — N6459 Other signs and symptoms in breast: Secondary | ICD-10-CM

## 2013-04-21 MED ORDER — METOCLOPRAMIDE HCL 10 MG PO TABS: 10.0000 mg | ORAL_TABLET | Freq: Four times a day (QID) | ORAL | Status: DC

## 2013-04-21 NOTE — Progress Notes (Signed)
Patient ID: Tina Osborn, female   DOB: Apr 08, 1986, 27 y.o.   MRN: [id number] Pt presents complaining of no let down  Exam  No mastitis focally but severe breast engorgement  Rx Reglan 10 QID Fenugreek 3 tabs 3 times adya  Follow up in 4 days

## 2013-04-21 NOTE — Progress Notes (Signed)
Post discharge chart review completed.  

## 2013-04-25 ENCOUNTER — Encounter: Payer: Self-pay | Admitting: Obstetrics & Gynecology

## 2013-04-25 ENCOUNTER — Ambulatory Visit (INDEPENDENT_AMBULATORY_CARE_PROVIDER_SITE_OTHER): Payer: PRIVATE HEALTH INSURANCE | Admitting: Obstetrics & Gynecology

## 2013-04-25 VITALS — BP 110/80 | Ht 66.0 in | Wt 170.0 lb

## 2013-04-25 DIAGNOSIS — O9229 Other disorders of breast associated with pregnancy and the puerperium: Secondary | ICD-10-CM

## 2013-04-25 DIAGNOSIS — IMO0002 Reserved for concepts with insufficient information to code with codable children: Secondary | ICD-10-CM

## 2013-04-25 NOTE — Progress Notes (Signed)
Patient ID: Tina Osborn, female   DOB: 17-Sep-1985, 27 y.o.   MRN: [id number] Shaquel is in for followup from last Friday She came in with a significantly symptomatic breast but no evidence of mastitis Concluded she was having difficulty with letdown but not production Began her in Reglan 10 mg 4 times a day as well as fenugreek 3 tablets 3 times a day She comes in today for scheduled followup She had to stop the Reglan because of side effects made her feel foggy  Today on exam she still has no evidence of mastitis The exam is greatly improved  I've encouraged her to continue the fenugreek to 3 times a day for the next 3 weeks or so It can take some time to work  In the meantime she will the persistent and supplement

## 2013-05-25 ENCOUNTER — Other Ambulatory Visit: Payer: Self-pay

## 2013-05-31 ENCOUNTER — Encounter: Payer: Self-pay | Admitting: Advanced Practice Midwife

## 2013-05-31 ENCOUNTER — Ambulatory Visit (INDEPENDENT_AMBULATORY_CARE_PROVIDER_SITE_OTHER): Payer: PRIVATE HEALTH INSURANCE | Admitting: Advanced Practice Midwife

## 2013-05-31 NOTE — Progress Notes (Signed)
  Tina Osborn is a 27 y.o. who presents for a postpartum visit. She is 6 weeks postpartum following a spontaneous vaginal delivery. I have fully reviewed the prenatal and intrapartum course. The delivery was at 39.0 gestational weeks.  Anesthesia: local. Postpartum course has been complicated by some breastfeeding problems, which did not respond to fenugreek, so she is now bottlefeeding. Baby's course has been uneventful. Baby is feeding by bottle. Bleeding: Started a period Friday. Bowel function is normal. Bladder function is normal. Patient is not sexually active. Contraception method is condoms. Postpartum depression screening: negative.    Review of Systems   Constitutional: Negative for fever and chills Eyes: Negative for visual disturbances Respiratory: Negative for shortness of breath, dyspnea Cardiovascular: Negative for chest pain or palpitations  Gastrointestinal: Negative for vomiting, diarrhea and constipation Genitourinary: Negative for dysuria and urgency Musculoskeletal: Negative for back pain, joint pain, myalgias  Neurological: Negative for dizziness and headaches   Objective:     Filed Vitals:   05/31/13 1134  BP: 120/82   General:  alert, cooperative and no distress   Breasts:  negative  Lungs: clear to auscultation bilaterally  Heart:  regular rate and rhythm  Abdomen: Soft, nontender   Vulva:  normal  Vagina: normal vagina  Cervix:  closed  Corpus: Well involuted     Rectal Exam: no hemorrhoids        Assessment:    normal postpartum exam.  Plan:    1. Contraception: condoms 2. Follow up in:  For annual exam

## 2013-07-25 ENCOUNTER — Other Ambulatory Visit: Payer: Self-pay | Admitting: Women's Health

## 2014-05-21 ENCOUNTER — Encounter: Payer: Self-pay | Admitting: Advanced Practice Midwife

## 2018-04-25 ENCOUNTER — Encounter: Payer: Self-pay | Admitting: Obstetrics & Gynecology

## 2018-04-25 ENCOUNTER — Other Ambulatory Visit (HOSPITAL_COMMUNITY)
Admission: RE | Admit: 2018-04-25 | Discharge: 2018-04-25 | Disposition: A | Discharge status: Home / Self Care | Payer: BLUE CROSS/BLUE SHIELD | Source: Ambulatory Visit | Attending: Obstetrics & Gynecology | Admitting: Obstetrics & Gynecology

## 2018-04-25 ENCOUNTER — Ambulatory Visit (INDEPENDENT_AMBULATORY_CARE_PROVIDER_SITE_OTHER): Payer: BLUE CROSS/BLUE SHIELD | Admitting: Obstetrics & Gynecology

## 2018-04-25 VITALS — BP 130/91 | HR 92 | Ht 66.0 in | Wt 147.6 lb

## 2018-04-25 DIAGNOSIS — Z01419 Encounter for gynecological examination (general) (routine) without abnormal findings: Secondary | ICD-10-CM

## 2018-04-25 DIAGNOSIS — R5383 Other fatigue: Secondary | ICD-10-CM

## 2018-04-25 NOTE — Progress Notes (Signed)
Subjective:     Tina Osborn is a 32 y.o. female here for a routine exam.  Patient's last menstrual period was 03/05/2018. [id number] Birth Control Method:  None/condoms Menstrual Calendar(currently): irregular, skips months  Current complaints: none.   Current acute medical issues:  none   Recent Gynecologic History Patient's last menstrual period was 03/05/2018. Last Pap: 2014,  normal Last mammogram: ,    Past Medical History:  Diagnosis Date  . HSV-2 seropositive 2014  . Medical history non-contributory     Past Surgical History:  Procedure Laterality Date  . NO PAST SURGERIES      OB History    Gravida  2   Para  2   Term  2   Preterm      AB      Living  2     SAB      TAB      Ectopic      Multiple      Live Births  2           Social History   Socioeconomic History  . Marital status: Married    Spouse name: Not on file  . Number of children: Not on file  . Years of education: Not on file  . Highest education level: Not on file  Occupational History  . Not on file  Social Needs  . Financial resource strain: Not on file  . Food insecurity:    Worry: Not on file    Inability: Not on file  . Transportation needs:    Medical: Not on file    Non-medical: Not on file  Tobacco Use  . Smoking status: Never Smoker  . Smokeless tobacco: Never Used  Substance and Sexual Activity  . Alcohol use: No  . Drug use: No  . Sexual activity: Yes    Birth control/protection: None  Lifestyle  . Physical activity:    Days per week: Not on file    Minutes per session: Not on file  . Stress: Not on file  Relationships  . Social connections:    Talks on phone: Not on file    Gets together: Not on file    Attends religious service: Not on file    Active member of club or organization: Not on file    Attends meetings of clubs or organizations: Not on file    Relationship status: Not on file  Other Topics Concern  . Not on file  Social  History Narrative  . Not on file    Family History  Problem Relation Age of Onset  . Cancer Mother        ovarian  . Hypertension Father   . Cancer Father        skin  . Cancer Paternal Grandmother        breast     Current Outpatient Medications:  .  Prenatal Vit-Fe Fumarate-FA (PRENATAL MULTIVITAMIN) TABS, Take 1 tablet by mouth daily at 12 noon., Disp: , Rfl:  .  ibuprofen (ADVIL,MOTRIN) 600 MG tablet, Take 1 tablet (600 mg total) by mouth every 6 (six) hours as needed for pain. (Patient not taking: Reported on 04/25/2018), Disp: 30 tablet, Rfl: 0  Review of Systems  Review of Systems  Constitutional: Negative for fever, chills, weight loss, malaise/fatigue and diaphoresis.  HENT: Negative for hearing loss, ear pain, nosebleeds, congestion, sore throat, neck pain, tinnitus and ear discharge.   Eyes: Negative for blurred vision, double vision, photophobia, pain, discharge  and redness.  Respiratory: Negative for cough, hemoptysis, sputum production, shortness of breath, wheezing and stridor.   Cardiovascular: Negative for chest pain, palpitations, orthopnea, claudication, leg swelling and PND.  Gastrointestinal: negative for abdominal pain. Negative for heartburn, nausea, vomiting, diarrhea, constipation, blood in stool and melena.  Genitourinary: Negative for dysuria, urgency, frequency, hematuria and flank pain.  Musculoskeletal: Negative for myalgias, back pain, joint pain and falls.  Skin: Negative for itching and rash.  Neurological: Negative for dizziness, tingling, tremors, sensory change, speech change, focal weakness, seizures, loss of consciousness, weakness and headaches.  Endo/Heme/Allergies: Negative for environmental allergies and polydipsia. Does not bruise/bleed easily.  Psychiatric/Behavioral: Negative for depression, suicidal ideas, hallucinations, memory loss and substance abuse. The patient is not nervous/anxious and does not have insomnia.        Objective:   Blood pressure (!) 130/91, pulse 92, height 5\' 6"  (1.676 m), weight 147 lb 9.6 oz (67 kg), last menstrual period 03/05/2018.   Physical Exam  Vitals reviewed. Constitutional: She is oriented to person, place, and time. She appears well-developed and well-nourished.  HENT:  Head: Normocephalic and atraumatic.        Right Ear: External ear normal.  Left Ear: External ear normal.  Nose: Nose normal.  Mouth/Throat: Oropharynx is clear and moist.  Eyes: Conjunctivae and EOM are normal. Pupils are equal, round, and reactive to light. Right eye exhibits no discharge. Left eye exhibits no discharge. No scleral icterus.  Neck: Normal range of motion. Neck supple. No tracheal deviation present. No thyromegaly present.  Cardiovascular: Normal rate, regular rhythm, normal heart sounds and intact distal pulses.  Exam reveals no gallop and no friction rub.   No murmur heard. Respiratory: Effort normal and breath sounds normal. No respiratory distress. She has no wheezes. She has no rales. She exhibits no tenderness.  GI: Soft. Bowel sounds are normal. She exhibits no distension and no mass. There is no tenderness. There is no rebound and no guarding.  Genitourinary:  Breasts no masses skin changes or nipple changes bilaterally      Vulva is normal without lesions Vagina is pink moist without discharge Cervix normal in appearance and pap is done Uterus is normal size shape and contour Adnexa is negative with normal sized ovaries   Musculoskeletal: Normal range of motion. She exhibits no edema and no tenderness.  Neurological: She is alert and oriented to person, place, and time. She has normal reflexes. She displays normal reflexes. No cranial nerve deficit. She exhibits normal muscle tone. Coordination normal.  Skin: Skin is warm and dry. No rash noted. No erythema. No pallor.  Psychiatric: She has a normal mood and affect. Her behavior is normal. Judgment and thought content normal.        Medications Ordered at today's visit: No orders of the defined types were placed in this encounter.   Other orders placed at today's visit: Orders Placed This Encounter  Procedures  . Thyroid Panel With TSH      Assessment:    Healthy female exam.    Plan:    Contraception: condoms.     Return in about 1 year (around 04/26/2019) for yearly.

## 2018-04-26 LAB — THYROID PANEL WITH TSH
Free Thyroxine Index: 2.2 (ref 1.2–4.9)
T3 Uptake Ratio: 26 % (ref 24–39)
T4, Total: 8.3 ug/dL (ref 4.5–12.0)
TSH: 1.91 u[IU]/mL (ref 0.450–4.500)

## 2018-04-26 LAB — CYTOLOGY - PAP
DIAGNOSIS: NEGATIVE
HPV (WINDOPATH): NOT DETECTED

## 2018-05-03 ENCOUNTER — Other Ambulatory Visit: Payer: Self-pay | Admitting: Obstetrics & Gynecology

## 2018-05-03 DIAGNOSIS — R5383 Other fatigue: Secondary | ICD-10-CM

## 2018-05-03 DIAGNOSIS — R232 Flushing: Secondary | ICD-10-CM

## 2018-05-10 LAB — CBC
Hematocrit: 42.8 % (ref 34.0–46.6)
Hemoglobin: 14.6 g/dL (ref 11.1–15.9)
MCH: 29.9 pg (ref 26.6–33.0)
MCHC: 34.1 g/dL (ref 31.5–35.7)
MCV: 88 fL (ref 79–97)
PLATELETS: 227 10*3/uL (ref 150–450)
RBC: 4.89 x10E6/uL (ref 3.77–5.28)
RDW: 12.2 % — ABNORMAL LOW (ref 12.3–15.4)
WBC: 4.5 10*3/uL (ref 3.4–10.8)

## 2018-05-10 LAB — COMPREHENSIVE METABOLIC PANEL
ALBUMIN: 5 g/dL (ref 3.5–5.5)
ALT: 14 IU/L (ref 0–32)
AST: 14 IU/L (ref 0–40)
Albumin/Globulin Ratio: 1.9 (ref 1.2–2.2)
Alkaline Phosphatase: 49 IU/L (ref 39–117)
BUN / CREAT RATIO: 15 (ref 9–23)
BUN: 13 mg/dL (ref 6–20)
Bilirubin Total: 0.6 mg/dL (ref 0.0–1.2)
CALCIUM: 10 mg/dL (ref 8.7–10.2)
CO2: 23 mmol/L (ref 20–29)
Chloride: 103 mmol/L (ref 96–106)
Creatinine, Ser: 0.86 mg/dL (ref 0.57–1.00)
GFR calc Af Amer: 105 mL/min/{1.73_m2} (ref >59)
GFR, EST NON AFRICAN AMERICAN: 91 mL/min/{1.73_m2} (ref >59)
GLOBULIN, TOTAL: 2.6 g/dL (ref 1.5–4.5)
Glucose: 80 mg/dL (ref 65–99)
Potassium: 4.6 mmol/L (ref 3.5–5.2)
SODIUM: 142 mmol/L (ref 134–144)
Total Protein: 7.6 g/dL (ref 6.0–8.5)

## 2018-05-10 LAB — ESTRADIOL: Estradiol: 207 pg/mL

## 2018-05-10 LAB — VITAMIN B12: VITAMIN B 12: 663 pg/mL (ref 232–1245)

## 2018-05-10 LAB — FOLLICLE STIMULATING HORMONE: FSH: 3.9 m[IU]/mL

## 2018-05-13 LAB — VITAMIN D 1,25 DIHYDROXY
Vitamin D 1, 25 (OH)2 Total: 33 pg/mL
Vitamin D3 1, 25 (OH)2: 33 pg/mL

## 2018-08-16 ENCOUNTER — Other Ambulatory Visit (HOSPITAL_COMMUNITY): Payer: Self-pay | Admitting: Adult Health Nurse Practitioner

## 2018-08-16 ENCOUNTER — Ambulatory Visit (HOSPITAL_COMMUNITY)
Admission: RE | Admit: 2018-08-16 | Discharge: 2018-08-16 | Disposition: A | Discharge status: Home / Self Care | Payer: BLUE CROSS/BLUE SHIELD | Source: Ambulatory Visit | Attending: Adult Health Nurse Practitioner | Admitting: Adult Health Nurse Practitioner

## 2018-08-16 DIAGNOSIS — R05 Cough: Secondary | ICD-10-CM

## 2018-08-16 DIAGNOSIS — R059 Cough, unspecified: Secondary | ICD-10-CM

## 2018-10-10 ENCOUNTER — Telehealth: Payer: BLUE CROSS/BLUE SHIELD | Admitting: Family

## 2018-10-10 DIAGNOSIS — J019 Acute sinusitis, unspecified: Secondary | ICD-10-CM | POA: Diagnosis not present

## 2018-10-10 DIAGNOSIS — B9689 Other specified bacterial agents as the cause of diseases classified elsewhere: Secondary | ICD-10-CM | POA: Diagnosis not present

## 2018-10-10 MED ORDER — AMOXICILLIN-POT CLAVULANATE 875-125 MG PO TABS: 1.0000 | ORAL_TABLET | Freq: Two times a day (BID) | ORAL | 0 refills | Status: DC

## 2018-10-10 NOTE — Progress Notes (Signed)

## 2019-03-18 ENCOUNTER — Telehealth: Payer: BLUE CROSS/BLUE SHIELD | Admitting: Family

## 2019-03-18 DIAGNOSIS — B9689 Other specified bacterial agents as the cause of diseases classified elsewhere: Secondary | ICD-10-CM

## 2019-03-18 DIAGNOSIS — J019 Acute sinusitis, unspecified: Secondary | ICD-10-CM | POA: Diagnosis not present

## 2019-03-18 MED ORDER — AMOXICILLIN-POT CLAVULANATE 875-125 MG PO TABS: 1.0000 | ORAL_TABLET | Freq: Two times a day (BID) | ORAL | 0 refills | Status: DC

## 2019-03-18 NOTE — Progress Notes (Signed)

## 2019-05-29 ENCOUNTER — Ambulatory Visit
Admission: EM | Admit: 2019-05-29 | Discharge: 2019-05-29 | Disposition: A | Discharge status: Home / Self Care | Payer: BC Managed Care – PPO | Attending: Emergency Medicine | Admitting: Emergency Medicine

## 2019-05-29 ENCOUNTER — Other Ambulatory Visit: Payer: Self-pay

## 2019-05-29 ENCOUNTER — Ambulatory Visit (INDEPENDENT_AMBULATORY_CARE_PROVIDER_SITE_OTHER): Payer: BC Managed Care – PPO

## 2019-05-29 DIAGNOSIS — M79644 Pain in right finger(s): Secondary | ICD-10-CM

## 2019-05-29 DIAGNOSIS — S6991XA Unspecified injury of right wrist, hand and finger(s), initial encounter: Secondary | ICD-10-CM | POA: Diagnosis not present

## 2019-05-29 DIAGNOSIS — M79641 Pain in right hand: Secondary | ICD-10-CM | POA: Diagnosis not present

## 2019-05-29 MED ORDER — MELOXICAM 7.5 MG PO TABS: 7.5000 mg | ORAL_TABLET | Freq: Every day | ORAL | 0 refills | Status: DC

## 2019-05-29 NOTE — Discharge Instructions (Addendum)
X-rays did not show fracture or dislocation Continue conservative management of rest, ice, and elevation Splint applied Take mobic as needed for pain relief (may cause abdominal discomfort, ulcers, and GI bleeds avoid taking with other NSAIDs) Follow up with PCP or orthopedist if symptoms persist Return or go to the ER if you have any new or worsening symptoms (fever, chills, redness, swelling, bruising, worsening symptoms despite medication, etc...)

## 2019-05-29 NOTE — ED Triage Notes (Signed)
Pt present with injury to right 4th and 5th finger when she fell last night

## 2019-05-29 NOTE — ED Provider Notes (Signed)
[place]   [id number] 05/29/19 Arrival Time: 4098  CC: Right hand pain/injury  SUBJECTIVE: History from: patient. Tina Osborn is a 33 y.o. female complains of 4th and 5th finger injury and pain that occurred last night.  Symptoms began after she slipped and hit the back of her 5th finger on her husbands jeep while trying to regain balance.  Localizes the pain to the back of RT 4th and 5th finger.  Describes the pain as constant and throbbing in character.  Has tried ibuprofen, ice, and elevation with minimal relief.  Symptoms are made worse with bending 5th finger.  Denies similar symptoms in the past.  Complains of mild swelling, and weakness.  Denies fever, chills, erythema, ecchymosis, numbness and tingling.    ROS: As per HPI.  All other pertinent ROS negative.     Past Medical History:  Diagnosis Date  . HSV-2 seropositive 2014  . Medical history non-contributory    Past Surgical History:  Procedure Laterality Date  . NO PAST SURGERIES     No Known Allergies No current facility-administered medications on file prior to encounter.    No current outpatient medications on file prior to encounter.   Social History   Socioeconomic History  . Marital status: Married    Spouse name: Not on file  . Number of children: Not on file  . Years of education: Not on file  . Highest education level: Not on file  Occupational History  . Not on file  Social Needs  . Financial resource strain: Not on file  . Food insecurity    Worry: Not on file    Inability: Not on file  . Transportation needs    Medical: Not on file    Non-medical: Not on file  Tobacco Use  . Smoking status: Never Smoker  . Smokeless tobacco: Never Used  Substance and Sexual Activity  . Alcohol use: No  . Drug use: No  . Sexual activity: Yes    Birth control/protection: None  Lifestyle  . Physical activity    Days per week: Not on file    Minutes per session: Not on file  . Stress: Not  on file  Relationships  . Social Herbalist on phone: Not on file    Gets together: Not on file    Attends religious service: Not on file    Active member of club or organization: Not on file    Attends meetings of clubs or organizations: Not on file    Relationship status: Not on file  . Intimate partner violence    Fear of current or ex partner: Not on file    Emotionally abused: Not on file    Physically abused: Not on file    Forced sexual activity: Not on file  Other Topics Concern  . Not on file  Social History Narrative  . Not on file   Family History  Problem Relation Age of Onset  . Cancer Mother        ovarian  . Hypertension Father   . Cancer Father        skin  . Cancer Paternal Grandmother        breast    OBJECTIVE:  Vitals:   05/29/19 1030  BP: (!) 130/91  Pulse: 95  Resp: 18  Temp: 98.2 F (36.8 C)  TempSrc: Oral  SpO2: 98%    General appearance: ALERT; in no acute distress.  Head: NCAT Lungs: Normal respiratory  effort CV: Radial pulses 2+. Cap refill < 2 seconds Musculoskeletal: Right hand Inspection: Skin warm, dry, clear and intact without obvious erythema, effusion, or ecchymosis.  Palpation: diffusely TTP over fourth digit and proximal fifth digit ROM: LROM about the 5th digit Strength: deferred Skin: warm and dry Neurologic: Ambulates without difficulty; Sensation intact about the upper extremities Psychological: alert and cooperative; normal mood and affect  DIAGNOSTIC STUDIES:  Dg Hand Complete Right  Result Date: 05/29/2019 CLINICAL DATA:  Slipped and fell yesterday and injured right hand. EXAM: RIGHT HAND - COMPLETE 3+ VIEW COMPARISON:  None. FINDINGS: The joint spaces are maintained. No degenerative changes. No acute abnormality. IMPRESSION: No acute bony findings. Electronically Signed   By: Rudie Meyer M.D.   On: 05/29/2019 11:14    X-rays negative for bony abnormalities including fracture, or dislocation.  No soft  tissue swelling.    I have reviewed the x-rays myself and the radiologist interpretation. I am in agreement with the radiologist interpretation.     ASSESSMENT & PLAN:  1. Injury of finger of right hand, initial encounter   2. Finger pain, right     Meds ordered this encounter  Medications  . meloxicam (MOBIC) 7.5 MG tablet    Sig: Take 1 tablet (7.5 mg total) by mouth daily.    Dispense:  30 tablet    Refill:  0    Order Specific Question:   Supervising Provider    Answer:   Eustace Moore [[id number]]   X-rays did not show fracture or dislocation Continue conservative management of rest, ice, and elevation Splint applied Take mobic as needed for pain relief (may cause abdominal discomfort, ulcers, and GI bleeds avoid taking with other NSAIDs) Follow up with PCP or orthopedist if symptoms persist Return or go to the ER if you have any new or worsening symptoms (fever, chills, redness, swelling, bruising, worsening symptoms despite medication, etc...)   Reviewed expectations re: course of current medical issues. Questions answered. Outlined signs and symptoms indicating need for more acute intervention. Patient verbalized understanding. After Visit Summary given.    Rennis Harding, PA-C 05/29/19 1352

## 2020-01-15 ENCOUNTER — Ambulatory Visit (HOSPITAL_COMMUNITY)
Admission: RE | Admit: 2020-01-15 | Discharge: 2020-01-15 | Disposition: A | Discharge status: Home / Self Care | Payer: BC Managed Care – PPO | Source: Ambulatory Visit | Attending: Internal Medicine | Admitting: Internal Medicine

## 2020-01-15 ENCOUNTER — Other Ambulatory Visit (HOSPITAL_COMMUNITY): Payer: Self-pay | Admitting: Internal Medicine

## 2020-01-15 ENCOUNTER — Other Ambulatory Visit: Payer: Self-pay

## 2020-01-15 DIAGNOSIS — R079 Chest pain, unspecified: Secondary | ICD-10-CM

## 2020-01-15 DIAGNOSIS — R0602 Shortness of breath: Secondary | ICD-10-CM

## 2020-01-22 NOTE — Progress Notes (Signed)
Cardiology Office Note:    Date:  01/24/2020   ID:  Tina Osborn, DOB November 07, 1985, MRN [id number]  PCP:  Benita Stabile, MD  Cardiologist:  No primary care provider on file.  Electrophysiologist:  None   Referring MD: Benita Stabile, MD   Chief Complaint  Patient presents with  . Chest Pain    History of Present Illness:    Tina Osborn is a 34 y.o. female with a hx of migraines who is referred by Dr. Margo Aye for evaluation of chest pain, palpitations, and shortness of breath.  She reports that she began having chest pain about 2 months ago.  Describes sharp pain over the left side of her chest, that radiates to left arm.  Lasts about 2 minutes and resolves.  Occurs nearly every day.  Can occur while resting.  However also reports that she has been having chest tightness when she exerts herself.  Also has been getting short of breath with exertion.  States that most exertion she does is walking up the hill to her house, states that it is a steep hill and she will chest tightness and shortness of breath with this.  She also reports that she has been having palpitations where she feels like her heart is racing.  Typically lasts for 1 to 2 minutes.  She has checked her heart rate and it has been up to 140s during episodes.  She has felt lightheaded but denies any syncope.  No smoking history.  No known history of heart disease in her immediate family.  Labs on 01/15/2020 showed normal thyroid studies, D-dimer, blood counts, LFTs, and renal function/electrolytes.  Past Medical History:  Diagnosis Date  . HSV-2 seropositive 2014  . Medical history non-contributory     Past Surgical History:  Procedure Laterality Date  . NO PAST SURGERIES      Current Medications: Current Meds  Medication Sig  . SUMAtriptan (IMITREX) 25 MG tablet Take by mouth.     Allergies:   Patient has no known allergies.   Social History   Socioeconomic History  . Marital status: Married    Spouse name:  Not on file  . Number of children: Not on file  . Years of education: Not on file  . Highest education level: Not on file  Occupational History  . Not on file  Tobacco Use  . Smoking status: Never Smoker  . Smokeless tobacco: Never Used  Substance and Sexual Activity  . Alcohol use: No  . Drug use: No  . Sexual activity: Yes    Birth control/protection: None  Other Topics Concern  . Not on file  Social History Narrative  . Not on file   Social Determinants of Health   Financial Resource Strain:   . Difficulty of Paying Living Expenses:   Food Insecurity:   . Worried About Programme researcher, broadcasting/film/video in the Last Year:   . Barista in the Last Year:   Transportation Needs:   . Freight forwarder (Medical):   Marland Kitchen Lack of Transportation (Non-Medical):   Physical Activity:   . Days of Exercise per Week:   . Minutes of Exercise per Session:   Stress:   . Feeling of Stress :   Social Connections:   . Frequency of Communication with Friends and Family:   . Frequency of Social Gatherings with Friends and Family:   . Attends Religious Services:   . Active Member of Clubs or Organizations:   .  Attends Banker Meetings:   Marland Kitchen Marital Status:      Family History: The patient's family history includes Cancer in her father, mother, and paternal grandmother; Hypertension in her father.  ROS:   Please see the history of present illness.     All other systems reviewed and are negative.  EKGs/Labs/Other Studies Reviewed:    The following studies were reviewed today:   EKG:  EKG is ordered today.  The ekg ordered today demonstrates sinus tachycardia, rate 110, right axis deviation  Recent Labs: No results found for requested labs within last 8760 hours.  Recent Lipid Panel No results found for: CHOL, TRIG, HDL, CHOLHDL, VLDL, LDLCALC, LDLDIRECT  Physical Exam:    VS:  BP 119/78   Pulse (!) 110   Temp (!) 97.3 F (36.3 C)   Ht 5\' 6"  (1.676 m)   Wt 153 lb  9.6 oz (69.7 kg)   SpO2 99%   BMI 24.79 kg/m     Wt Readings from Last 3 Encounters:  01/23/20 153 lb 9.6 oz (69.7 kg)  04/25/18 147 lb 9.6 oz (67 kg)  05/31/13 164 lb 8 oz (74.6 kg)     GEN: Well nourished, well developed in no acute distress HEENT: Normal NECK: No JVD; No carotid bruits LYMPHATICS: No lymphadenopathy CARDIAC: regular, tachycardic, no murmurs, rubs, gallops RESPIRATORY:  Clear to auscultation without rales, wheezing or rhonchi  ABDOMEN: Soft, non-tender, non-distended MUSCULOSKELETAL:  No edema; No deformity  SKIN: Warm and dry NEUROLOGIC:  Alert and oriented x 3 PSYCHIATRIC:  Normal affect   ASSESSMENT:    1. Palpitations   2. Chest pain of uncertain etiology   3. Shortness of breath    PLAN:    Chest pain/dyspnea on exertion: Description concerning for angina as describes exertional chest tightness, though age and lack of cardiac risk factors suggest low probability of cardiac etiology of symptoms.  Will evaluate further with ETT and echocardiogram.  Palpitations: Description concerning for arrhythmia, suspect SVT.  Will evaluate with Zio patch x7 days  RTC in 2 months   Medication Adjustments/Labs and Tests Ordered: Current medicines are reviewed at length with the patient today.  Concerns regarding medicines are outlined above.  Orders Placed This Encounter  Procedures  . EXERCISE TOLERANCE TEST (ETT)  . LONG TERM MONITOR (3-14 DAYS)  . EKG 12-Lead  . ECHOCARDIOGRAM COMPLETE   No orders of the defined types were placed in this encounter.   Patient Instructions  Medication Instructions:  Your physician recommends that you continue on your current medications as directed. Please refer to the Current Medication list given to you today.  Testing/Procedures: Your physician has requested that you have an exercise tolerance test. For further information please visit 13/12/14. Please also follow instruction sheet, as given.  Your  physician has requested that you have an echocardiogram. Echocardiography is a painless test that uses sound waves to create images of your heart. It provides your doctor with information about the size and shape of your heart and how well your heart's chambers and valves are working. This procedure takes approximately one hour. There are no restrictions for this procedure. This will be done at our [facility] location:  [address]   ZIO XT- Long Term Monitor Instructions   Your physician has requested you wear your ZIO patch monitor 7 days.   This is a single patch monitor.  Ir[employer] supplies one patch monitor per enrollment.  Additional stickers are not  available.   Please do not apply patch if you will be having a Nuclear Stress Test, Echocardiogram, Cardiac CT, MRI, or Chest Xray during the time frame you would be wearing the monitor. The patch cannot be worn during these tests.  You cannot remove and re-apply the ZIO XT patch monitor.   Your ZIO patch monitor will be sent USPS Priority mail from [facility] directly to your home address. The monitor may also be mailed to a PO BOX if home delivery is not available.   It may take 3-5 days to receive your monitor after you have been enrolled.   Once you have received you monitor, please review enclosed instructions.  Your monitor has already been registered assigning a specific monitor serial # to you.   Applying the monitor   Shave hair from upper left chest.   Hold abrader disc by orange tab.  Rub abrader in 40 strokes over left upper chest as indicated in your monitor instructions.   Clean area with 4 enclosed alcohol pads .  Use all pads to assure are is cleaned thoroughly.  Let dry.   Apply patch as indicated in monitor instructions.  Patch will be place under collarbone on left side of chest with arrow pointing upward.   Rub patch adhesive wings for 2 minutes.Remove white label marked "1".  Remove white  label marked "2".  Rub patch adhesive wings for 2 additional minutes.   While looking in a mirror, press and release button in center of patch.  A small green light will flash 3-4 times .  This will be your only indicator the monitor has been turned on.     Do not shower for the first 24 hours.  You may shower after the first 24 hours.   Press button if you feel a symptom. You will hear a small click.  Record Date, Time and Symptom in the Patient Log Book.   When you are ready to remove patch, follow instructions on last 2 pages of Patient Log Book.  Stick patch monitor onto last page of Patient Log Book.   Place Patient Log Book in [place] box.  Use locking tab on box and tape box closed securely.  The [employer] and [employer] box has [employer] Cop[employer]aid postage on it.  Please place in mailbox as soon as possible.  Your physician should have your test results approximately 7 days after the monitor has been mailed back to [facility].   Call [facility] Customer Care at [phone] if you have questions regarding your ZIO XT patch monitor.  Call them immediately if you see an orange light blinking on your monitor.   If your monitor falls off in less than 4 days contact our Monitor department at [phone].  If your monitor becomes loose or falls off after 4 days call [employer] at [phone] for suggestions on securing your monitor.    Follow-Up: At [facility], you and your health needs are our priority.  As part of our continuing mission to provide you with exceptional heart care, we have created designated Provider Care Teams.  These Care Teams include your primary Cardiologist (physician) and Advanced Practice Providers (APPs -  Physician Assistants and Nurse Practitioners) who all work together to provide you with the care you need, when you need it.  We recommend signing up for the patient portal called "MyChart".  Sign up information is provided on this After Visit Summary.  MyChart is used to  connect with patients for Virtual  Visits (Telemedicine).  Patients are able to view lab/test results, encounter notes, upcoming appointments, etc.  Non-urgent messages can be sent to your provider as well.   To learn more about what you can do with MyChart, go to [email or web address].    Your next appointment:   2 month(s)  The format for your next appointment:   In Person  Provider:   Epifanio Lesches, MD        Signed, Little Ishikawa, MD  01/24/2020 12:44 AM    [facility]

## 2020-01-23 ENCOUNTER — Encounter: Payer: Self-pay | Admitting: Cardiology

## 2020-01-23 ENCOUNTER — Ambulatory Visit: Payer: BC Managed Care – PPO | Admitting: Cardiology

## 2020-01-23 ENCOUNTER — Other Ambulatory Visit: Payer: Self-pay

## 2020-01-23 ENCOUNTER — Encounter: Payer: Self-pay | Admitting: *Deleted

## 2020-01-23 VITALS — BP 119/78 | HR 110 | Temp 97.3°F | Ht 66.0 in | Wt 153.6 lb

## 2020-01-23 DIAGNOSIS — R002 Palpitations: Secondary | ICD-10-CM

## 2020-01-23 DIAGNOSIS — R079 Chest pain, unspecified: Secondary | ICD-10-CM | POA: Diagnosis not present

## 2020-01-23 DIAGNOSIS — R0602 Shortness of breath: Secondary | ICD-10-CM

## 2020-01-23 NOTE — Patient Instructions (Signed)
Medication Instructions:  Your physician recommends that you continue on your current medications as directed. Please refer to the Current Medication list given to you today.  Testing/Procedures: Your physician has requested that you have an exercise tolerance test. For further information please visit [email or web address] Please also follow instruction sheet, as given.  Your physician has requested that you have an echocardiogram. Echocardiography is a painless test that uses sound waves to create images of your heart. It provides your doctor with information about the size and shape of your heart and how well your heart's chambers and valves are working. This procedure takes approximately one hour. There are no restrictions for this procedure. This will be done at our [facility] location:  [address]   ZIO XT- Long Term Monitor Instructions   Your physician has requested you wear your ZIO patch monitor 7 days.   This is a single patch monitor.  Irhythm supplies one patch monitor per enrollment.  Additional stickers are not available.   Please do not apply patch if you will be having a Nuclear Stress Test, Echocardiogram, Cardiac CT, MRI, or Chest Xray during the time frame you would be wearing the monitor. The patch cannot be worn during these tests.  You cannot remove and re-apply the ZIO XT patch monitor.   Your ZIO patch monitor will be sent USPS Priority mail from [facility] directly to your home address. The monitor may also be mailed to a PO BOX if home delivery is not available.   It may take 3-5 days to receive your monitor after you have been enrolled.   Once you have received you monitor, please review enclosed instructions.  Your monitor has already been registered assigning a specific monitor serial # to you.   Applying the monitor   Shave hair from upper left chest.   Hold abrader disc by orange tab.  Rub abrader in 40 strokes over left upper  chest as indicated in your monitor instructions.   Clean area with 4 enclosed alcohol pads .  Use all pads to assure are is cleaned thoroughly.  Let dry.   Apply patch as indicated in monitor instructions.  Patch will be place under collarbone on left side of chest with arrow pointing upward.   Rub patch adhesive wings for 2 minutes.Remove white label marked "1".  Remove white label marked "2".  Rub patch adhesive wings for 2 additional minutes.   While looking in a mirror, press and release button in center of patch.  A small green light will flash 3-4 times .  This will be your only indicator the monitor has been turned on.     Do not shower for the first 24 hours.  You may shower after the first 24 hours.   Press button if you feel a symptom. You will hear a small click.  Record Date, Time and Symptom in the Patient Log Book.   When you are ready to remove patch, follow instructions on last 2 pages of Patient Log Book.  Stick patch monitor onto last page of Patient Log Book.   Place Patient Log Book in [place] box.  Use locking tab on box and tape box closed securely.  The Orange and [employer] has [employer] on it.  Please place in mailbox as soon as possible.  Your physician should have your test results approximately 7 days after the monitor has been mailed back to [facility].   Call [employer]  Care at [phone] if you have questions regarding your ZIO XT patch monitor.  Call them immediately if you see an orange light blinking on your monitor.   If your monitor falls off in less than 4 days contact our Monitor department at [phone].  If your monitor becomes loose or falls off after 4 days call [employer] at [phone] for suggestions on securing your monitor.    Follow-Up: At [facility], you and your health needs are our priority.  As part of our continuing mission to provide you with exceptional heart care, we have created designated Provider Care  Teams.  These Care Teams include your primary Cardiologist (physician) and Advanced Practice Providers (APPs -  Physician Assistants and Nurse Practitioners) who all work together to provide you with the care you need, when you need it.  We recommend signing up for the patient portal called "MyChart".  Sign up information is provided on this After Visit Summary.  MyChart is used to connect with patients for Virtual Visits (Telemedicine).  Patients are able to view lab/test results, encounter notes, upcoming appointments, etc.  Non-urgent messages can be sent to your provider as well.   To learn more about what you can do with MyChart, go to [email or web address].    Your next appointment:   2 month(s)  The format for your next appointment:   In Person  Provider:   Epifanio Lesches, MD

## 2020-01-23 NOTE — Progress Notes (Signed)
Patient ID: Tina Osborn, female   DOB: [birth year], 34 y.o.   MRN: [id number] Patient enrolled for a 7 day ZIO XT long term holter monitor to be shipped to her home.

## 2020-01-24 ENCOUNTER — Telehealth (HOSPITAL_COMMUNITY): Payer: Self-pay

## 2020-01-24 NOTE — Telephone Encounter (Signed)
Encounter complete. 

## 2020-01-26 ENCOUNTER — Ambulatory Visit (INDEPENDENT_AMBULATORY_CARE_PROVIDER_SITE_OTHER): Payer: BC Managed Care – PPO

## 2020-01-26 ENCOUNTER — Other Ambulatory Visit: Payer: Self-pay

## 2020-01-26 ENCOUNTER — Ambulatory Visit (HOSPITAL_COMMUNITY)
Admission: RE | Admit: 2020-01-26 | Discharge: 2020-01-26 | Disposition: A | Discharge status: Home / Self Care | Payer: BC Managed Care – PPO | Source: Ambulatory Visit | Attending: Cardiovascular Disease | Admitting: Cardiovascular Disease

## 2020-01-26 DIAGNOSIS — R079 Chest pain, unspecified: Secondary | ICD-10-CM | POA: Insufficient documentation

## 2020-01-26 DIAGNOSIS — R002 Palpitations: Secondary | ICD-10-CM | POA: Diagnosis not present

## 2020-01-26 LAB — EXERCISE TOLERANCE TEST
Estimated workload: 7.5 METS
Exercise duration (min): 6 min
Exercise duration (sec): 24 s
MPHR: 188 {beats}/min
Peak HR: 196 {beats}/min
Percent HR: 104 %
Rest HR: 125 {beats}/min

## 2020-02-06 ENCOUNTER — Other Ambulatory Visit: Payer: Self-pay

## 2020-02-06 ENCOUNTER — Ambulatory Visit (HOSPITAL_COMMUNITY): Payer: BC Managed Care – PPO | Attending: Cardiology

## 2020-02-06 DIAGNOSIS — R0602 Shortness of breath: Secondary | ICD-10-CM | POA: Diagnosis not present

## 2020-02-06 LAB — ECHOCARDIOGRAM COMPLETE
Area-P 1/2: 3.08 cm2
S' Lateral: 2.45 cm

## 2020-02-16 ENCOUNTER — Telehealth: Payer: Self-pay | Admitting: Cardiology

## 2020-02-16 NOTE — Telephone Encounter (Signed)
  STAT if HR is under 50 or over 120 (normal HR is 60-100 beats per minute)  1) What is your heart rate? 95  2) Do you have a log of your heart rate readings (document readings)? no  3) Do you have any other symptoms? Swelling of feet more frequently than normal.   Pt states her HR jumps up multiple times a day and it stays elevated until she gets a chance to sit down and relax. There are days where she has chest pressure, like it feels like someone is sitting on her chest. Patient says she gets a sharp, stabbing type pain that is located on on the upper part of her arm between her elbow and shoulder. She is trying to be better about relaxing and reducing the pain instead of pushing through it. She  wanted to know if there was anything she can do to help relieve her problems in the intermediate while she waits for her monitor results to come back.

## 2020-02-16 NOTE — Telephone Encounter (Signed)
Returned call to patient of Dr. Bjorn Pippin who reports HR is going up, chest pressure, sharp pains, lower extremity swelling. Reiterated that echo/GXT are WNL per chart review and monitor is pending. She received confirmation that the monitor was received by the ZIO processing center on 7/20 - explained that no report is in epic yet. She states she is going on vacation next week and wanted to start any new meds (if needed) while she was on vacation, instead of work. Advised will notify MD and send message to monitor team to advise on status of monitor report

## 2020-02-18 NOTE — Telephone Encounter (Signed)
We are still waiting on monitor, will let her know results as soon as available

## 2020-02-19 NOTE — Telephone Encounter (Signed)
Message sent to [facility] to please expedite processing of ZIO XT monitor [id number].

## 2020-02-28 ENCOUNTER — Telehealth: Payer: Self-pay | Admitting: Cardiology

## 2020-02-28 NOTE — Telephone Encounter (Signed)
Patient would like to change providers from Dr. Bjorn Pippin to Dr. Eden Emms. Please let the patient know what the office decides

## 2020-02-28 NOTE — Telephone Encounter (Signed)
Patient does not need regular cardiology f/u I do not need to see

## 2020-02-28 NOTE — Telephone Encounter (Signed)
Routed to MD's.

## 2020-03-01 NOTE — Telephone Encounter (Signed)
MyChart message sent to patient.

## 2020-03-04 ENCOUNTER — Ambulatory Visit
Admission: EM | Admit: 2020-03-04 | Discharge: 2020-03-04 | Disposition: A | Discharge status: Home / Self Care | Payer: BC Managed Care – PPO | Attending: Emergency Medicine | Admitting: Emergency Medicine

## 2020-03-04 ENCOUNTER — Other Ambulatory Visit: Payer: Self-pay

## 2020-03-04 DIAGNOSIS — Z1152 Encounter for screening for COVID-19: Secondary | ICD-10-CM | POA: Diagnosis not present

## 2020-03-04 NOTE — ED Triage Notes (Signed)
Pt here for covid test after positive exposure, no symptoms  °

## 2020-03-05 LAB — NOVEL CORONAVIRUS, NAA: SARS-CoV-2, NAA: DETECTED — AB

## 2020-03-05 LAB — SARS-COV-2, NAA 2 DAY TAT

## 2020-03-26 ENCOUNTER — Telehealth: Payer: BC Managed Care – PPO | Admitting: Nurse Practitioner

## 2020-03-26 DIAGNOSIS — N3000 Acute cystitis without hematuria: Secondary | ICD-10-CM | POA: Diagnosis not present

## 2020-03-26 MED ORDER — NITROFURANTOIN MONOHYD MACRO 100 MG PO CAPS: 100.0000 mg | ORAL_CAPSULE | Freq: Two times a day (BID) | ORAL | 0 refills | Status: DC

## 2020-03-26 NOTE — Progress Notes (Signed)

## 2020-03-28 ENCOUNTER — Encounter (HOSPITAL_COMMUNITY): Payer: Self-pay

## 2020-03-28 ENCOUNTER — Emergency Department (HOSPITAL_COMMUNITY): Payer: BC Managed Care – PPO

## 2020-03-28 ENCOUNTER — Emergency Department (HOSPITAL_COMMUNITY)
Admission: EM | Admit: 2020-03-28 | Discharge: 2020-03-29 | Disposition: A | Discharge status: Home / Self Care | Payer: BC Managed Care – PPO | Attending: Emergency Medicine | Admitting: Emergency Medicine

## 2020-03-28 DIAGNOSIS — R0781 Pleurodynia: Secondary | ICD-10-CM

## 2020-03-28 DIAGNOSIS — R091 Pleurisy: Secondary | ICD-10-CM | POA: Insufficient documentation

## 2020-03-28 LAB — TROPONIN I (HIGH SENSITIVITY)
Troponin I (High Sensitivity): 3 ng/L (ref <18)
Troponin I (High Sensitivity): 3 ng/L (ref <18)

## 2020-03-28 LAB — BASIC METABOLIC PANEL
Anion gap: 12 (ref 5–15)
BUN: 14 mg/dL (ref 6–20)
CO2: 21 mmol/L — ABNORMAL LOW (ref 22–32)
Calcium: 9.7 mg/dL (ref 8.9–10.3)
Chloride: 107 mmol/L (ref 98–111)
Creatinine, Ser: 0.66 mg/dL (ref 0.44–1.00)
GFR calc Af Amer: >60 mL/min (ref >60)
GFR calc non Af Amer: >60 mL/min (ref >60)
Glucose, Bld: 98 mg/dL (ref 70–99)
Potassium: 3.8 mmol/L (ref 3.5–5.1)
Sodium: 140 mmol/L (ref 135–145)

## 2020-03-28 LAB — I-STAT BETA HCG BLOOD, ED (MC, WL, AP ONLY): I-stat hCG, quantitative: <5 m[IU]/mL (ref <5)

## 2020-03-28 LAB — CBC
HCT: 40.1 % (ref 36.0–46.0)
Hemoglobin: 12.9 g/dL (ref 12.0–15.0)
MCH: 29.5 pg (ref 26.0–34.0)
MCHC: 32.2 g/dL (ref 30.0–36.0)
MCV: 91.8 fL (ref 80.0–100.0)
Platelets: 159 10*3/uL (ref 150–400)
RBC: 4.37 MIL/uL (ref 3.87–5.11)
RDW: 13.1 % (ref 11.5–15.5)
WBC: 5.3 10*3/uL (ref 4.0–10.5)
nRBC: 0 % (ref 0.0–0.2)

## 2020-03-28 NOTE — ED Triage Notes (Signed)
Pt arrives to ED w/ c/o 8/10 intermittent L sided chest pain radiating into L shoulder that started this morning. Pt endorses sob and pain w/ inspiration. HR 113 in triage.

## 2020-03-29 ENCOUNTER — Emergency Department (HOSPITAL_COMMUNITY): Payer: BC Managed Care – PPO

## 2020-03-29 MED ORDER — KETOROLAC TROMETHAMINE 30 MG/ML IJ SOLN: 15.0000 mg | Freq: Once | INTRAMUSCULAR | Status: DC

## 2020-03-29 MED ORDER — IOHEXOL 350 MG/ML SOLN
60.0000 mL | Freq: Once | INTRAVENOUS | Status: AC | PRN
  Administered 2020-03-29: 60 mL via INTRAVENOUS

## 2020-03-29 MED ORDER — MORPHINE SULFATE (PF) 4 MG/ML IV SOLN
4.0000 mg | Freq: Once | INTRAVENOUS | Status: AC
  Administered 2020-03-29: 4 mg via INTRAVENOUS
  Filled 2020-03-29: qty 1

## 2020-03-29 MED ORDER — ONDANSETRON HCL 4 MG/2ML IJ SOLN
4.0000 mg | Freq: Once | INTRAMUSCULAR | Status: AC
  Administered 2020-03-29: 4 mg via INTRAVENOUS
  Filled 2020-03-29: qty 2

## 2020-03-29 NOTE — ED Notes (Signed)
E-signature pad unavailable at time of pt discharge. This RN discussed discharge materials with pt and answered all pt questions. Pt stated understanding of discharge material. ? ?

## 2020-03-29 NOTE — ED Provider Notes (Signed)
[facility] EMERGENCY DEPARTMENT Provider Note   CSN: [id number] Arrival date & time: 03/28/20  1821     History Chief Complaint  Patient presents with   Chest Pain    Tina Osborn is a 34 y.o. female.   34 year old female presents to the emergency department for evaluation of left-sided chest pain which began yesterday morning and worsened at approximately 1700.  Initially nagging and aching, now sharp.  It has been constant and present in her left upper chest, radiating to her left shoulder blade.  Her symptoms are aggravated with deep breathing.  She did test positive for Covid approximately 1 month ago.  Has been experiencing a residual cough which also aggravates her pain.  She has not taken any medications prior to arrival.  Reports that mother has a history of VTE and is chronically anticoagulated.  Patient, herself, has no history of DVT or PE.  She denies fever, syncope, vomiting, abdominal pain, hemoptysis, leg swelling, recent surgeries or hospitalizations.  The history is provided by the patient. No language interpreter was used.  Chest Pain      Past Medical History:  Diagnosis Date   HSV-2 seropositive 2014   Medical history non-contributory     Patient Active Problem List   Diagnosis Date Noted   Ineffective breast feeding 04/25/2013   HSV-2 seropositive     Past Surgical History:  Procedure Laterality Date   NO PAST SURGERIES       OB History    Gravida  2   Para  2   Term  2   Preterm      AB      Living  2     SAB      TAB      Ectopic      Multiple      Live Births  2           Family History  Problem Relation Age of Onset   Cancer Mother        ovarian   Hypertension Father    Cancer Father        skin   Cancer Paternal Grandmother        breast    Social History   Tobacco Use   Smoking status: Never Smoker   Smokeless tobacco: Never Used  Substance Use Topics   Alcohol use:  No   Drug use: No    Home Medications Prior to Admission medications   Medication Sig Start Date End Date Taking? Authorizing Provider  nitrofurantoin, macrocrystal-monohydrate, (MACROBID) 100 MG capsule Take 1 capsule (100 mg total) by mouth 2 (two) times daily. 1 po BId 03/26/20   Daphine Deutscher, Mary-Margaret, FNP  SUMAtriptan (IMITREX) 25 MG tablet Take by mouth. 09/18/19   [provider]    Allergies    Patient has no known allergies.  Review of Systems   Review of Systems  Cardiovascular: Positive for chest pain.  Ten systems reviewed and are negative for acute change, except as noted in the HPI.    Physical Exam Updated Vital Signs BP 124/85 (BP Location: Right Arm)    Pulse 94    Temp 98 F (36.7 C) (Oral)    Resp 16    SpO2 100%   Physical Exam Vitals and nursing note reviewed.  Constitutional:      General: She is not in acute distress.    Appearance: She is well-developed. She is not diaphoretic.     Comments:  Appears uncomfortable, but nontoxic.  HENT:     Head: Normocephalic and atraumatic.  Eyes:     General: No scleral icterus.    Conjunctiva/sclera: Conjunctivae normal.  Cardiovascular:     Rate and Rhythm: Normal rate and regular rhythm.     Pulses: Normal pulses.  Pulmonary:     Effort: Pulmonary effort is normal. No respiratory distress.     Breath sounds: No stridor. No wheezing, rhonchi or rales.     Comments: Lungs CTAB Musculoskeletal:        General: Normal range of motion.     Cervical back: Normal range of motion.  Skin:    General: Skin is warm and dry.     Coloration: Skin is not pale.     Findings: No erythema or rash.  Neurological:     Mental Status: She is alert and oriented to person, place, and time.     Coordination: Coordination normal.  Psychiatric:        Behavior: Behavior normal.     ED Results / Procedures / Treatments   Labs (all labs ordered are listed, but only abnormal results are displayed) Labs Reviewed  BASIC  METABOLIC PANEL - Abnormal; Notable for the following components:      Result Value   CO2 21 (*)    All other components within normal limits  CBC  I-STAT BETA HCG BLOOD, ED ([facility], [facility], [facility] ONLY)  TROPONIN I (HIGH SENSITIVITY)  TROPONIN I (HIGH SENSITIVITY)    EKG None  Radiology DG Chest 2 View  Result Date: 03/28/2020 CLINICAL DATA:  Chest pain EXAM: CHEST - 2 VIEW COMPARISON:  January 15, 2000 FINDINGS: The heart size and mediastinal contours are within normal limits. Both lungs are clear. The visualized skeletal structures are unremarkable. IMPRESSION: No active cardiopulmonary disease. Electronically Signed   By: Katherine Mantle M.D.   On: 03/28/2020 18:57   CT Angio Chest PE W and/or Wo Contrast  Result Date: 03/29/2020 CLINICAL DATA:  Shortness of breath and left chest pain. Pulmonary embolus suspected with high probability. EXAM: CT ANGIOGRAPHY CHEST WITH CONTRAST TECHNIQUE: Multidetector CT imaging of the chest was performed using the standard protocol during bolus administration of intravenous contrast. Multiplanar CT image reconstructions and MIPs were obtained to evaluate the vascular anatomy. CONTRAST:  21mL OMNIPAQUE IOHEXOL 350 MG/ML SOLN COMPARISON:  None. FINDINGS: Cardiovascular: Good opacification of the central and segmental pulmonary arteries. No focal filling defects. No evidence of significant pulmonary embolus. Normal heart size. No pericardial effusions. Normal caliber thoracic aorta. No evidence of aortic dissection. Mediastinum/Nodes: No enlarged mediastinal, hilar, or axillary lymph nodes. Thyroid gland, trachea, and esophagus demonstrate no significant findings. Lungs/Pleura: Lungs are clear. No pleural effusion or pneumothorax. Upper Abdomen: No acute process demonstrated in the visualized upper abdomen. Musculoskeletal: No destructive bone lesions. Ribs and sternum are nondepressed. Review of the MIP images confirms the above findings. IMPRESSION: 1. No evidence of  significant pulmonary embolus. 2. No evidence of active pulmonary disease. Electronically Signed   By: Burman Nieves M.D.   On: 03/29/2020 03:35    Procedures Procedures (including critical care time)  Medications Ordered in ED Medications  ketorolac (TORADOL) 30 MG/ML injection 15 mg (has no administration in time range)  morphine 4 MG/ML injection 4 mg (4 mg Intravenous Given 03/29/20 0315)  ondansetron (ZOFRAN) injection 4 mg (4 mg Intravenous Given 03/29/20 0313)  iohexol (OMNIPAQUE) 350 MG/ML injection 60 mL (60 mLs Intravenous Contrast Given 03/29/20 0332)    ED  Course  I have reviewed the triage vital signs and the nursing notes.  Pertinent labs & imaging results that were available during my care of the patient were reviewed by me and considered in my medical decision making (see chart for details).  Clinical Course as of Mar 29 414  Fri Mar 29, 2020  0230 Patient presenting for left-sided pleuritic chest pain onset yesterday.  She has no history of DVT or PE, but mother is chronically anticoagulated for VTE history.  Patient notably tachycardic, but references hx of resting sinus tachycardia.  Plan for CT angiogram to rule out pulmonary embolus.  Her chest x-ray shows no pneumonia, pneumothorax, pleural effusion, rib fracture.  Troponin negative x2 and symptoms atypical for ACS.  Also felt unlikely given patient's age and lack of risk factors.  Discussed possibility of costochondritis 2/2 cough if imaging negative.   [KH]  0415 CTA negative. Patient feeling better. Will d/c.   [KH]    Clinical Course User Index [KH] Darylene Price   MDM Rules/Calculators/A&P                          34 year old female presents to the emergency department for evaluation of pleuritic chest pain. She had "nagging" discomfort since the morning which worsened at 1700 and became sharp, radiating towards the back. Aggravated with breathing.  Noted to be tachycardic on arrival, reporting  history of chronic anticoagulation mother secondary to VTE. Underwent CTA of the chest which is negative for pulmonary embolus. No other acute cardiopulmonary abnormality identified. She has had troponin levels trended which are also negative. Her symptoms are atypical for ACS.  Patient was recently diagnosed with COVID-19 1 month ago. Has been experiencing a residual cough. Discussed the possibility of costochondritis as cause of symptoms today. Encouraged follow-up with her primary care doctor.  Return precautions discussed and provided. Patient discharged in stable condition with no unaddressed concerns.   Final Clinical Impression(s) / ED Diagnoses Final diagnoses:  Pleuritic chest pain    Rx / DC Orders ED Discharge Orders    None       Antony Madura, PA-C 03/29/20 0417    Dione Booze, MD 03/29/20 [phone]

## 2020-04-05 NOTE — Progress Notes (Signed)
Cardiology Office Note:    Date:  04/08/2020   ID:  Tina Osborn, DOB [birth year], MRN [id number]  PCP:  Benita Stabile, MD  Cardiologist:  No primary care provider on file.  Electrophysiologist:  None status  Referring MD: Benita Stabile, MD   Chief Complaint  Patient presents with  . Chest Pain    History of Present Illness:    Tina Osborn is a 34 y.o. female with a hx of migraines who presents for follow-up.  She was referred by Dr. Margo Aye for evaluation of chest pain, palpitations, and shortness of breath, initially seen on 01/23/2020.  She reports that she began having chest pain about 2 months ago.  Describes sharp pain over the left side of her chest, that radiates to left arm.  Lasts about 2 minutes and resolves.  Occurs nearly every day.  Can occur while resting.  However also reports that she has been having chest tightness when she exerts herself.  Also has been getting short of breath with exertion.  States that most exertion she does is walking up the hill to her house, states that it is a steep hill and she will chest tightness and shortness of breath with this.  She also reports that she has been having palpitations where she feels like her heart is racing.  Typically lasts for 1 to 2 minutes.  She has checked her heart rate and it has been up to 140s during episodes.  She has felt lightheaded but denies any syncope.  No smoking history.  No known history of heart disease in her immediate family.  Labs on 01/15/2020 showed normal thyroid studies, D-dimer, blood counts, LFTs, and renal function/electrolytes.  ETT on 01/26/2020 showed no evidence of ischemia.  Echocardiogram on 02/06/2020 showed normal biventricular function, no significant valvular disease.  Zio patch x7 days on 02/20/2020 showed one 5 beat run of NSVT, patient triggered events corresponded to sinus rhythm.  Since last clinic visit, she was seen in the ED for atypical chest pain on 03/29/2020.  Work-up including CTA  chest was negative.  High-sensitivity troponins negative.  She reports that she had chest pain that lasted for about 2 hours that day.  Has continued to have chest pain occurring every day.  Saw Dr. Margo Aye on Monday and was started on gabapentin and propranolol.  Reports she has had some improvement in her pain   Past Medical History:  Diagnosis Date  . HSV-2 seropositive 2014  . Medical history non-contributory     Past Surgical History:  Procedure Laterality Date  . NO PAST SURGERIES      Current Medications: Current Meds  Medication Sig  . gabapentin (NEURONTIN) 100 MG capsule Take 100 mg by mouth every 8 (eight) hours.  . pantoprazole (PROTONIX) 40 MG tablet Take 40 mg by mouth daily.  . propranolol (INDERAL) 10 MG tablet Take by mouth.  . SUMAtriptan (IMITREX) 25 MG tablet Take by mouth.     Allergies:   Patient has no known allergies.   Social History   Socioeconomic History  . Marital status: Married    Spouse name: Not on file  . Number of children: Not on file  . Years of education: Not on file  . Highest education level: Not on file  Occupational History  . Not on file  Tobacco Use  . Smoking status: Never Smoker  . Smokeless tobacco: Never Used  Substance and Sexual Activity  . Alcohol use: No  . Drug use:  No  . Sexual activity: Yes    Birth control/protection: None  Other Topics Concern  . Not on file  Social History Narrative  . Not on file   Social Determinants of Health   Financial Resource Strain:   . Difficulty of Paying Living Expenses: Not on file  Food Insecurity:   . Worried About Programme researcher, broadcasting/film/video in the Last Year: Not on file  . Ran Out of Food in the Last Year: Not on file  Transportation Needs:   . Lack of Transportation (Medical): Not on file  . Lack of Transportation (Non-Medical): Not on file  Physical Activity:   . Days of Exercise per Week: Not on file  . Minutes of Exercise per Session: Not on file  Stress:   . Feeling of  Stress : Not on file  Social Connections:   . Frequency of Communication with Friends and Family: Not on file  . Frequency of Social Gatherings with Friends and Family: Not on file  . Attends Religious Services: Not on file  . Active Member of Clubs or Organizations: Not on file  . Attends Banker Meetings: Not on file  . Marital Status: Not on file     Family History: The patient's family history includes Cancer in her father, mother, and paternal grandmother; Hypertension in her father.  ROS:   Please see the history of present illness.     All other systems reviewed and are negative.  EKGs/Labs/Other Studies Reviewed:    The following studies were reviewed today:   EKG:  EKG is ordered today.  The ekg ordered today demonstrates sinus tachycardia, rate 80, no ST/T abnormalities  Recent Labs: 03/28/2020: BUN 14; Creatinine, Ser 0.66; Hemoglobin 12.9; Platelets 159; Potassium 3.8; Sodium 140  Recent Lipid Panel No results found for: CHOL, TRIG, HDL, CHOLHDL, VLDL, LDLCALC, LDLDIRECT  Physical Exam:    VS:  BP 116/77   Pulse 80   Ht 5\' 6"  (1.676 m)   Wt 157 lb 3.2 oz (71.3 kg)   LMP 04/06/2020   SpO2 99%   BMI 25.37 kg/m     Wt Readings from Last 3 Encounters:  04/08/20 157 lb 3.2 oz (71.3 kg)  01/23/20 153 lb 9.6 oz (69.7 kg)  04/25/18 147 lb 9.6 oz (67 kg)     GEN: Well nourished, well developed in no acute distress HEENT: Normal NECK: No JVD; No carotid bruits LYMPHATICS: No lymphadenopathy CARDIAC: regular, tachycardic, no murmurs, rubs, gallops RESPIRATORY:  Clear to auscultation without rales, wheezing or rhonchi  ABDOMEN: Soft, non-tender, non-distended MUSCULOSKELETAL:  No edema; No deformity  SKIN: Warm and dry NEUROLOGIC:  Alert and oriented x 3 PSYCHIATRIC:  Normal affect   ASSESSMENT:    1. Chest pain of uncertain etiology   2. Palpitations    PLAN:    Chest pain/dyspnea on exertion: Description concerning for angina as describes  exertional chest tightness, though age and lack of cardiac risk factors suggest low probability of cardiac etiology of symptoms.  ETT on 01/26/2020 showed no evidence of ischemia.  Echocardiogram on 02/06/2020 showed normal biventricular function, no significant valvular disease.  Recently had continuous chest pain x2 hours, with negative high-sensitivity troponins in the ED.  Suspect noncardiac etiology.  No further cardiac work-up recommended.  Palpitations: Zio patch x7 days on 02/20/2020 showed one 5 beat run of NSVT, patient triggered events corresponded to sinus rhythm.  RTC as needed   Medication Adjustments/Labs and Tests Ordered: Current medicines are reviewed  at length with the patient today.  Concerns regarding medicines are outlined above.  Orders Placed This Encounter  Procedures  . EKG 12-Lead   No orders of the defined types were placed in this encounter.   Patient Instructions   Follow-Up: At [facility], you and your health needs are our priority.  As part of our continuing mission to provide you with exceptional heart care, we have created designated Provider Care Teams.  These Care Teams include your primary Cardiologist (physician) and Advanced Practice Providers (APPs -  Physician Assistants and Nurse Practitioners) who all work together to provide you with the care you need, when you need it.  We recommend signing up for the patient portal called "MyChart".  Sign up information is provided on this After Visit Summary.  MyChart is used to connect with patients for Virtual Visits (Telemedicine).  Patients are able to view lab/test results, encounter notes, upcoming appointments, etc.  Non-urgent messages can be sent to your provider as well.   To learn more about what you can do with MyChart, go to [email or web address].    Your next appointment:   Follow up on an as-needed basis with Dr. Bjorn Pippin.     Signed, Little Ishikawa, MD  04/08/2020 5:30 PM    [facility]

## 2020-04-08 ENCOUNTER — Encounter: Payer: Self-pay | Admitting: Cardiology

## 2020-04-08 ENCOUNTER — Ambulatory Visit: Payer: BC Managed Care – PPO | Admitting: Cardiology

## 2020-04-08 ENCOUNTER — Other Ambulatory Visit: Payer: Self-pay

## 2020-04-08 VITALS — BP 116/77 | HR 80 | Ht 66.0 in | Wt 157.2 lb

## 2020-04-08 DIAGNOSIS — R079 Chest pain, unspecified: Secondary | ICD-10-CM | POA: Diagnosis not present

## 2020-04-08 DIAGNOSIS — R002 Palpitations: Secondary | ICD-10-CM

## 2020-04-08 NOTE — Patient Instructions (Signed)
  Follow-Up: At [facility], you and your health needs are our priority.  As part of our continuing mission to provide you with exceptional heart care, we have created designated Provider Care Teams.  These Care Teams include your primary Cardiologist (physician) and Advanced Practice Providers (APPs -  Physician Assistants and Nurse Practitioners) who all work together to provide you with the care you need, when you need it.  We recommend signing up for the patient portal called "MyChart".  Sign up information is provided on this After Visit Summary.  MyChart is used to connect with patients for Virtual Visits (Telemedicine).  Patients are able to view lab/test results, encounter notes, upcoming appointments, etc.  Non-urgent messages can be sent to your provider as well.   To learn more about what you can do with MyChart, go to [email or web address].    Your next appointment:   Follow up on an as-needed basis with Dr. Bjorn Pippin.

## 2020-12-02 ENCOUNTER — Other Ambulatory Visit: Payer: Self-pay

## 2020-12-02 ENCOUNTER — Ambulatory Visit (INDEPENDENT_AMBULATORY_CARE_PROVIDER_SITE_OTHER): Payer: BC Managed Care – PPO

## 2020-12-02 ENCOUNTER — Ambulatory Visit
Admission: EM | Admit: 2020-12-02 | Discharge: 2020-12-02 | Disposition: A | Discharge status: Home / Self Care | Payer: BC Managed Care – PPO | Attending: Emergency Medicine | Admitting: Emergency Medicine

## 2020-12-02 DIAGNOSIS — M62838 Other muscle spasm: Secondary | ICD-10-CM

## 2020-12-02 DIAGNOSIS — M542 Cervicalgia: Secondary | ICD-10-CM

## 2020-12-02 MED ORDER — NAPROXEN 500 MG PO TABS: 500.0000 mg | ORAL_TABLET | Freq: Two times a day (BID) | ORAL | 0 refills | Status: DC

## 2020-12-02 MED ORDER — CYCLOBENZAPRINE HCL 5 MG PO TABS: 5.0000 mg | ORAL_TABLET | Freq: Three times a day (TID) | ORAL | 0 refills | Status: DC | PRN

## 2020-12-02 NOTE — Discharge Instructions (Addendum)
Your xray is normal today which is reassuring.  I believe muscle strain and spasm is contributing to your persistent pain.  Massage, heat, stretching to help with this.  Naproxen twice a day, take with food. Flexeril as a muscle relaxer. May cause drowsiness. Please do not take if driving or drinking alcohol.

## 2020-12-02 NOTE — ED Provider Notes (Signed)
[facility]    CSN: [id number] Arrival date & time: 12/02/20  0859      History   Chief Complaint Chief Complaint  Patient presents with  . Neck Injury  . Shoulder Pain    HPI Tina Osborn is a 35 y.o. female.   Tina Osborn presents with complaints of neck pain and left paraspinal and shoulder pain. Started two days ago. Causes some headache. She was playing with her children, heard/ felt a "pop" and has had pain since. No previous similar. No radiation of pain to left arm. No numbness or tingling. Ibuprofen does provide some mild relief. Hasn't taken today. No fevers. Pain with neck ROM. She is right handed.    ROS per HPI, negative if not otherwise mentioned.      Past Medical History:  Diagnosis Date  . HSV-2 seropositive 2014  . Medical history non-contributory     Patient Active Problem List   Diagnosis Date Noted  . Ineffective breast feeding 04/25/2013  . HSV-2 seropositive     Past Surgical History:  Procedure Laterality Date  . NO PAST SURGERIES      OB History    Gravida  2   Para  2   Term  2   Preterm      AB      Living  2     SAB      IAB      Ectopic      Multiple      Live Births  2            Home Medications    Prior to Admission medications   Medication Sig Start Date End Date Taking? Authorizing Provider  cyclobenzaprine (FLEXERIL) 5 MG tablet Take 1 tablet (5 mg total) by mouth 3 (three) times daily as needed for muscle spasms. 12/02/20  Yes Yue Flanigan, Dorene Grebe B, NP  naproxen (NAPROSYN) 500 MG tablet Take 1 tablet (500 mg total) by mouth 2 (two) times daily. 12/02/20  Yes Tomeko Scoville, Dorene Grebe B, NP  gabapentin (NEURONTIN) 100 MG capsule Take 100 mg by mouth every 8 (eight) hours. 04/01/20   [provider]  pantoprazole (PROTONIX) 40 MG tablet Take 40 mg by mouth daily. 04/01/20   [provider]  propranolol (INDERAL) 10 MG tablet Take by mouth. 04/01/20   [provider]   SUMAtriptan (IMITREX) 25 MG tablet Take by mouth. 09/18/19   [provider]    Family History Family History  Problem Relation Age of Onset  . Cancer Mother        ovarian  . Hypertension Father   . Cancer Father        skin  . Cancer Paternal Grandmother        breast    Social History Social History   Tobacco Use  . Smoking status: Never Smoker  . Smokeless tobacco: Never Used  Substance Use Topics  . Alcohol use: No  . Drug use: No     Allergies   Patient has no known allergies.   Review of Systems Review of Systems   Physical Exam Triage Vital Signs ED Triage Vitals [12/02/20 1016]  Enc Vitals Group     BP 120/85     Pulse Rate 80     Resp 18     Temp 98.2 F (36.8 C)     Temp src      SpO2 99 %     Weight  Height      Head Circumference      Peak Flow      Pain Score 6     Pain Loc      Pain Edu?      Excl. in GC?    No data found.  Updated Vital Signs BP 120/85   Pulse 80   Temp 98.2 F (36.8 C)   Resp 18   LMP 10/28/2020   SpO2 99%   Visual Acuity Right Eye Distance:   Left Eye Distance:   Bilateral Distance:    Right Eye Near:   Left Eye Near:    Bilateral Near:     Physical Exam Constitutional:      General: She is not in acute distress.    Appearance: She is well-developed.  Cardiovascular:     Rate and Rhythm: Normal rate.  Pulmonary:     Effort: Pulmonary effort is normal.  Musculoskeletal:     Cervical back: Tenderness and bony tenderness present. No swelling, edema, deformity, erythema or signs of trauma. Pain with movement present. Decreased range of motion.       Back:     Comments: Cervical spine with point tenderness as well as tenderness to left paraspinous musculature; full ROM of left arm and sensation intact; neck is mobile but limited by pain, worse with flexion than extension  Skin:    General: Skin is warm and dry.  Neurological:     Mental Status: She is alert and oriented to person,  place, and time.      UC Treatments / Results  Labs (all labs ordered are listed, but only abnormal results are displayed) Labs Reviewed - No data to display  EKG   Radiology DG Cervical Spine Complete  Result Date: 12/02/2020 CLINICAL DATA:  Neck pain EXAM: CERVICAL SPINE - COMPLETE 4+ VIEW COMPARISON:  None. FINDINGS: There is no evidence of cervical spine fracture or prevertebral soft tissue swelling. Alignment is normal. No other significant bone abnormalities are identified. IMPRESSION: Negative cervical spine radiographs. Electronically Signed   By: Charlett Nose M.D.   On: 12/02/2020 11:11    Procedures Procedures (including critical care time)  Medications Ordered in UC Medications - No data to display  Initial Impression / Assessment and Plan / UC Course  I have reviewed the triage vital signs and the nursing notes.  Pertinent labs & imaging results that were available during my care of the patient were reviewed by me and considered in my medical decision making (see chart for details).     Xray without acute findings. Sprain and spasm. No other red flag findings. Pain management and expected course of rehab discussed. Return precautions provided. Patient verbalized understanding and agreeable to plan.   Final Clinical Impressions(s) / UC Diagnoses   Final diagnoses:  Neck pain  Neck muscle spasm     Discharge Instructions     Your xray is normal today which is reassuring.  I believe muscle strain and spasm is contributing to your persistent pain.  Massage, heat, stretching to help with this.  Naproxen twice a day, take with food. Flexeril as a muscle relaxer. May cause drowsiness. Please do not take if driving or drinking alcohol.      ED Prescriptions    Medication Sig Dispense Auth. Provider   naproxen (NAPROSYN) 500 MG tablet Take 1 tablet (500 mg total) by mouth 2 (two) times daily. 30 tablet Linus Mako B, NP   cyclobenzaprine (FLEXERIL) 5 MG  tablet Take 1 tablet (5 mg total) by mouth 3 (three) times daily as needed for muscle spasms. 15 tablet Georgetta Haber, NP     PDMP not reviewed this encounter.   Georgetta Haber, NP 12/02/20 1514

## 2020-12-02 NOTE — ED Triage Notes (Signed)
Pt presents with left shoulder pain and neck pain, heard pop in scapula area and has pain sense

## 2021-03-17 ENCOUNTER — Other Ambulatory Visit: Payer: Self-pay

## 2021-03-17 ENCOUNTER — Encounter: Payer: Self-pay | Admitting: Emergency Medicine

## 2021-03-17 ENCOUNTER — Ambulatory Visit
Admission: EM | Admit: 2021-03-17 | Discharge: 2021-03-17 | Disposition: A | Discharge status: Home / Self Care | Payer: BC Managed Care – PPO | Attending: Family Medicine | Admitting: Family Medicine

## 2021-03-17 DIAGNOSIS — R112 Nausea with vomiting, unspecified: Secondary | ICD-10-CM

## 2021-03-17 DIAGNOSIS — L559 Sunburn, unspecified: Secondary | ICD-10-CM | POA: Diagnosis not present

## 2021-03-17 MED ORDER — HYDROCORTISONE 1 % EX CREA: TOPICAL_CREAM | CUTANEOUS | 0 refills | Status: DC

## 2021-03-17 MED ORDER — ONDANSETRON HCL 4 MG PO TABS: 4.0000 mg | ORAL_TABLET | Freq: Four times a day (QID) | ORAL | 0 refills | Status: DC

## 2021-03-17 MED ORDER — ONDANSETRON 4 MG PO TBDP
4.0000 mg | ORAL_TABLET | Freq: Once | ORAL | Status: AC
  Administered 2021-03-17: 4 mg via ORAL

## 2021-03-17 NOTE — ED Provider Notes (Signed)
[facility]    CSN: [id number] Arrival date & time: 03/17/21  0810      History   Chief Complaint Chief Complaint  Patient presents with   Sunburn    HPI Tina Osborn is a 35 y.o. female.   HPI Patient presents today for evaluation of nausea and vomiting following sustaining a generalized body sunburn yesterday while mowing the grass.  Patient's complete exposed skin is erythematous and mildly pruritic.  She reports she has had a few episodes of vomiting but has persistently remained nauseous.  She has attempted to hydrate well with fluids but has subsequently had intermittent vomiting.  She is afebrile.  Endorses previous episodes of sunburns.  Past Medical History:  Diagnosis Date   HSV-2 seropositive 2014   Medical history non-contributory     Patient Active Problem List   Diagnosis Date Noted   Ineffective breast feeding 04/25/2013   HSV-2 seropositive     Past Surgical History:  Procedure Laterality Date   NO PAST SURGERIES      OB History     Gravida  2   Para  2   Term  2   Preterm      AB      Living  2      SAB      IAB      Ectopic      Multiple      Live Births  2            Home Medications    Prior to Admission medications   Medication Sig Start Date End Date Taking? Authorizing Provider  hydrocortisone cream 1 % Apply to affected area 2 times daily 03/17/21  Yes Bing Neighbors, FNP  ondansetron (ZOFRAN) 4 MG tablet Take 1 tablet (4 mg total) by mouth every 6 (six) hours. 03/17/21  Yes Bing Neighbors, FNP  cyclobenzaprine (FLEXERIL) 5 MG tablet Take 1 tablet (5 mg total) by mouth 3 (three) times daily as needed for muscle spasms. 12/02/20   Georgetta Haber, NP  gabapentin (NEURONTIN) 100 MG capsule Take 100 mg by mouth every 8 (eight) hours. 04/01/20   [provider]  naproxen (NAPROSYN) 500 MG tablet Take 1 tablet (500 mg total) by mouth 2 (two) times daily. 12/02/20   Georgetta Haber, NP   pantoprazole (PROTONIX) 40 MG tablet Take 40 mg by mouth daily. 04/01/20   [provider]  propranolol (INDERAL) 10 MG tablet Take by mouth. 04/01/20   [provider]  SUMAtriptan (IMITREX) 25 MG tablet Take by mouth. 09/18/19   [provider]    Family History Family History  Problem Relation Age of Onset   Cancer Mother        ovarian   Hypertension Father    Cancer Father        skin   Cancer Paternal Grandmother        breast    Social History Social History   Tobacco Use   Smoking status: Never   Smokeless tobacco: Never  Substance Use Topics   Alcohol use: No   Drug use: No     Allergies   Patient has no known allergies.   Review of Systems Review of Systems Pertinent negatives listed in HPI   Physical Exam Triage Vital Signs ED Triage Vitals [03/17/21 0822]  Enc Vitals Group     BP 108/74     Pulse Rate 78     Resp 18  Temp 98.1 F (36.7 C)     Temp Source Oral     SpO2 100 %     Weight      Height      Head Circumference      Peak Flow      Pain Score 6     Pain Loc      Pain Edu?      Excl. in GC?    No data found.  Updated Vital Signs BP 108/74 (BP Location: Right Arm)   Pulse 78   Temp 98.1 F (36.7 C) (Oral)   Resp 18   LMP 03/10/2021   SpO2 100%   Visual Acuity Right Eye Distance:   Left Eye Distance:   Bilateral Distance:    Right Eye Near:   Left Eye Near:    Bilateral Near:     Physical Exam General appearance: Alert, well developed, well nourished, cooperative  Head: Normocephalic, without obvious abnormality, atraumatic Respiratory: Respirations even and unlabored, normal respiratory rate Heart: rate and rhythm normal. No gallop or murmurs noted on exam  Abdomen: BS hyperactive,  no distention, no rebound tenderness, or no mass Extremities: No gross deformities Skin: General bilateral upper extremities bilateral lower extremities and upper torso erythematous macular rash without  blistering Psych: Appropriate mood and affect. Neurologic: GCS 15, normal coordination normal gait UC Treatments / Results  Labs (all labs ordered are listed, but only abnormal results are displayed) Labs Reviewed - No data to display  EKG   Radiology No results found.  Procedures Procedures (including critical care time)  Medications Ordered in UC Medications  ondansetron (ZOFRAN-ODT) disintegrating tablet 4 mg (4 mg Oral Given 03/17/21 0914)    Initial Impression / Assessment and Plan / UC Course  I have reviewed the triage vital signs and the nursing notes.  Pertinent labs & imaging results that were available during my care of the patient were reviewed by me and considered in my medical decision making (see chart for details).    Patient presents today with a generalized body sunburn rash. No evidence of any blistering or sign toxicity Will trial hydrocortisone cream as needed Zofran given here in clinic for nausea continue outpatient management of nausea with Zofran. Advance diet from clear liquids to solids as tolerated. Return precautions given.  Patient is overall stable and generally appears well. Final Clinical Impressions(s) / UC Diagnoses   Final diagnoses:  Sunburn  Nausea and vomiting, intractability of vomiting not specified, unspecified vomiting type   Discharge Instructions   None    ED Prescriptions     Medication Sig Dispense Auth. Provider   ondansetron (ZOFRAN) 4 MG tablet Take 1 tablet (4 mg total) by mouth every 6 (six) hours. 12 tablet Bing Neighbors, FNP   hydrocortisone cream 1 % Apply to affected area 2 times daily 454 g Bing Neighbors, FNP      PDMP not reviewed this encounter.   Bing Neighbors, [place] 03/17/21 [id number]

## 2021-03-17 NOTE — ED Triage Notes (Addendum)
Sunburn to arms and legs after mowing grass yesterday.  Pt reports she had some nausea and vomited x 1 this morning.

## 2021-09-12 ENCOUNTER — Encounter: Payer: Self-pay | Admitting: Obstetrics & Gynecology

## 2021-09-12 ENCOUNTER — Other Ambulatory Visit: Payer: Self-pay

## 2021-09-12 ENCOUNTER — Other Ambulatory Visit (HOSPITAL_COMMUNITY)
Admission: RE | Admit: 2021-09-12 | Discharge: 2021-09-12 | Disposition: A | Discharge status: Home / Self Care | Payer: BC Managed Care – PPO | Source: Ambulatory Visit | Attending: Obstetrics & Gynecology | Admitting: Obstetrics & Gynecology

## 2021-09-12 ENCOUNTER — Ambulatory Visit (INDEPENDENT_AMBULATORY_CARE_PROVIDER_SITE_OTHER): Payer: BC Managed Care – PPO | Admitting: Obstetrics & Gynecology

## 2021-09-12 VITALS — BP 126/86 | HR 82 | Ht 66.0 in | Wt 165.4 lb

## 2021-09-12 DIAGNOSIS — N939 Abnormal uterine and vaginal bleeding, unspecified: Secondary | ICD-10-CM

## 2021-09-12 DIAGNOSIS — R102 Pelvic and perineal pain: Secondary | ICD-10-CM | POA: Diagnosis not present

## 2021-09-12 DIAGNOSIS — N941 Unspecified dyspareunia: Secondary | ICD-10-CM | POA: Diagnosis not present

## 2021-09-12 DIAGNOSIS — Z01411 Encounter for gynecological examination (general) (routine) with abnormal findings: Secondary | ICD-10-CM

## 2021-09-12 DIAGNOSIS — Z01419 Encounter for gynecological examination (general) (routine) without abnormal findings: Secondary | ICD-10-CM | POA: Insufficient documentation

## 2021-09-12 DIAGNOSIS — K5900 Constipation, unspecified: Secondary | ICD-10-CM

## 2021-09-12 MED ORDER — NORELGESTROMIN-ETH ESTRADIOL 150-35 MCG/24HR TD PTWK: 1.0000 | MEDICATED_PATCH | TRANSDERMAL | 4 refills | Status: DC

## 2021-09-12 NOTE — Progress Notes (Signed)
WELL-WOMAN EXAMINATION Patient name: Tina Osborn MRN [id number]  Date of birth: 05-01-1986 Chief Complaint:   Gynecologic Exam  History of Present Illness:   Tina Osborn is a 36 y.o. G74P2002  female being seen today for a routine well-woman exam and the following concerns:  Menstrual concerns: Menses are irregular.  Bleeding typically will last about 1-1/2 weeks- 4 days of bleeding- using both pad/tampon change every 4hrs then stops for 3-4 days then restarted for another week.   + dysmenorrhea and hot flashes before/during period.  Avoids NSAIDs due to easy bruising. -At time menses will start earlier than anticipated  She also notes dyspareunia and pelvic pain week before menses.  Additionally, notes pain with bowel movement week before and during menses.   -She was previously on OCPs many years ago, but when she became pregnancy while on pill she stopped.  She does think her period was more regular with OCP   The current method of family planning is condoms.   Last pap 2019.  Last mammogram: n/a. Last colonoscopy: n/a  Depression screen [facility] 2/9 04/25/2018  Decreased Interest 2  Down, Depressed, Hopeless 0  PHQ - 2 Score 2  Change in appetite 2  Feeling bad or failure about yourself  0  Trouble concentrating 0  Moving slowly or fidgety/restless 0  Suicidal thoughts 0  Difficult doing work/chores Not difficult at all      Review of Systems:   Pertinent items are noted in HPI Denies any headaches, blurred vision, fatigue, shortness of breath, chest pain, abdominal pain, urination concerns unless otherwise stated above.  Pertinent History Reviewed:  Reviewed past medical,surgical, social and family history.  Reviewed problem list, medications and allergies. Physical Assessment:   Vitals:   09/12/21 0838  BP: 126/86  Pulse: 82  Weight: 165 lb 6.4 oz (75 kg)  Height: 5\' 6"  (1.676 m)  Body mass index is 26.7 kg/m.        Physical Examination:   General  appearance - well appearing, and in no distress  Mental status - alert, oriented to person, place, and time  Psych:  She has a normal mood and affect  Skin - warm and dry, normal color, no suspicious lesions noted  Chest - effort normal, all lung fields clear to auscultation bilaterally  Heart - normal rate and regular rhythm  Neck:  midline trachea, no thyromegaly or nodules  Breasts - breasts appear normal, no suspicious masses, no skin or nipple changes or  axillary nodes  Abdomen - soft, nontender, nondistended, no masses or organomegaly, no reproducible pain  Pelvic - VULVA: normal appearing vulva with no masses, tenderness or lesions  VAGINA: normal appearing vagina with normal color and discharge, no lesions  CERVIX: normal appearing cervix without discharge or lesions, no CMT  Thin prep pap is done with HR HPV cotesting  UTERUS: uterus is felt to be normal size, shape, consistency and nontender   ADNEXA: No adnexal masses or tenderness noted.  Extremities:  No swelling or varicosities noted  Chaperone:     Assessment & Plan:  1) Well-Woman Exam -pap collected reviewed guidelines  2) Suspected Endometriosis- dyspareunia, pelvic pain, AUB -based on symptoms concern for endometriosis -reviewed use of NSAIDs and medical management with either pills, patch or LARCs -discussed potential for [place] and ultimately possible surgical intervention -Reviewed pros/cons of each option -for now trial of continuous patch, f/u in 3-84mos -also discussed NSAID use but pt hesitant due to bruising  Meds:  Meds ordered this encounter  Medications   norelgestromin-ethinyl estradiol [facility]ico) 150-35 MCG/24HR transdermal patch    Sig: Place 1 patch onto the skin once a week. Without a week break    Dispense:  12 patch    Refill:  4    Follow-up: Return in about 3 months (around 12/10/2021) for Medication follow up.   Myna Hidalgo, DO Attending Obstetrician & Gynecologist,  [facility] for [employer], [facility]

## 2021-09-16 LAB — CYTOLOGY - PAP
Comment: NEGATIVE
Diagnosis: NEGATIVE
High risk HPV: NEGATIVE

## 2021-10-18 ENCOUNTER — Telehealth: Payer: BC Managed Care – PPO | Admitting: Nurse Practitioner

## 2021-10-18 DIAGNOSIS — N3000 Acute cystitis without hematuria: Secondary | ICD-10-CM | POA: Diagnosis not present

## 2021-10-18 MED ORDER — NITROFURANTOIN MONOHYD MACRO 100 MG PO CAPS: 100.0000 mg | ORAL_CAPSULE | Freq: Two times a day (BID) | ORAL | 0 refills | Status: AC

## 2021-10-18 NOTE — Progress Notes (Signed)
I have spent 5 minutes in review of e-visit questionnaire, review and updating patient chart, medical decision making and response to patient.  ° °Ohm Dentler W Sueann Brownley, NP ° °  °

## 2021-10-18 NOTE — Progress Notes (Signed)

## 2021-12-08 ENCOUNTER — Encounter: Payer: Self-pay | Admitting: Obstetrics & Gynecology

## 2021-12-10 ENCOUNTER — Ambulatory Visit (INDEPENDENT_AMBULATORY_CARE_PROVIDER_SITE_OTHER): Payer: BC Managed Care – PPO | Admitting: Obstetrics & Gynecology

## 2021-12-10 ENCOUNTER — Encounter: Payer: Self-pay | Admitting: Obstetrics & Gynecology

## 2021-12-10 VITALS — BP 117/82 | HR 72 | Ht 66.0 in | Wt 165.2 lb

## 2021-12-10 DIAGNOSIS — R102 Pelvic and perineal pain: Secondary | ICD-10-CM

## 2021-12-10 DIAGNOSIS — N939 Abnormal uterine and vaginal bleeding, unspecified: Secondary | ICD-10-CM | POA: Diagnosis not present

## 2021-12-10 DIAGNOSIS — N946 Dysmenorrhea, unspecified: Secondary | ICD-10-CM | POA: Diagnosis not present

## 2021-12-10 DIAGNOSIS — N941 Unspecified dyspareunia: Secondary | ICD-10-CM | POA: Diagnosis not present

## 2021-12-10 MED ORDER — IBUPROFEN 600 MG PO TABS: 600.0000 mg | ORAL_TABLET | Freq: Four times a day (QID) | ORAL | 3 refills | Status: DC | PRN

## 2021-12-10 NOTE — Progress Notes (Signed)
GYN VISIT Patient name: Tina Osborn MRN [id number]  Date of birth: Dec 18, 1985 Chief Complaint:   Follow-up (On birth control; has had period for almost 2 weeks)  History of Present Illness:   Tina Osborn is a 36 y.o. G63P2002 female being seen today for follow up regarding the following:  Was doing ok, until about 2 weeks ago started having a period.  Menses started on the 13th- initially light bleeding the first week, now a bit heavier.  Typically 4-5 pads per day.  Taking ibuprofen as needed for dysmenorrhea.  Prior to this she was not having any bleeding.    She has had some time to think about this and does not want to be on medication forever.  She does not want any more children and wishes to consider a more permanent option.  In review, reports irregular menses with periods lasting for up to 10 days using 3-4 pads per day.  +Dysmenorrhea, Dyschezia, hot flashes, dyspareunia and pelvic pain leading up to her period  Patient's last menstrual period was 11/29/2021 (approximate).     04/25/2018    9:02 AM  Depression screen PHQ 2/9  Decreased Interest 2  Down, Depressed, Hopeless 0  PHQ - 2 Score 2  Change in appetite 2  Feeling bad or failure about yourself  0  Trouble concentrating 0  Moving slowly or fidgety/restless 0  Suicidal thoughts 0  Difficult doing work/chores Not difficult at all     Review of Systems:   Pertinent items are noted in HPI Denies fever/chills, dizziness, headaches, visual disturbances, fatigue, shortness of breath, chest pain, abdominal pain, vomiting. Pertinent History Reviewed:  Reviewed past medical,surgical, social, obstetrical and family history.  Reviewed problem list, medications and allergies. Physical Assessment:   Vitals:   12/10/21 0925  BP: 117/82  Pulse: 72  Weight: 165 lb 3.2 oz (74.9 kg)  Height: 5\' 6"  (1.676 m)  Body mass index is 26.66 kg/m.       Physical Examination:   General appearance: alert, well  appearing, and in no distress  Psych: mood appropriate, normal affect  Skin: warm & dry   Cardiovascular: normal heart rate noted  Respiratory: normal respiratory effort, no distress  Abdomen: soft, non-tender   Pelvic: VULVA: normal appearing vulva with no masses, tenderness or lesions, VAGINA: normal appearing vagina with normal color and discharge, no lesions, UTERUS: uterus is normal size, shape, consistency and nontender, mobile  Extremities: no edema, no calf tenderness bilaterally  Chaperone:  pt declined     Assessment & Plan:  1) AUB, Dysmenorrhea- concern for endometriosis -reviewed management options ranging from medical to surgical intervention -will also plan for pelvic to r/o underlying etiology- last completed in pregnancy 2014 -pt had done some research on her own and was already considering hysterectomy -reviewed risk/benefit including but not limited to risk of bleeding, infection and injury to surrounding organs -discussed ovarian preservation -discussed plan for LAVH, BS due to concern for endometriosis -Questions and concerns were addressed -pt aware that scheduling may be in 2-4-mos -plan for additional preop visit closer to surgical date -ok to take ibuprofen with food when needed  -for now plan to continue with patch- ok to take one week break when breakthrough bleeding occurs  Meds ordered this encounter  Medications   ibuprofen (ADVIL) 600 MG tablet    Sig: Take 1 tablet (600 mg total) by mouth every 6 (six) hours as needed for moderate pain or cramping.    Dispense:  30 tablet    Refill:  3      Return for TBD. Pending timing of surgical scheduling   Myna Hidalgo, DO Attending Obstetrician & Gynecologist, [facility] for [facility], [facility]

## 2021-12-12 ENCOUNTER — Ambulatory Visit (HOSPITAL_COMMUNITY)
Admission: RE | Admit: 2021-12-12 | Discharge: 2021-12-12 | Disposition: A | Discharge status: Home / Self Care | Payer: BC Managed Care – PPO | Source: Ambulatory Visit | Attending: Obstetrics & Gynecology | Admitting: Obstetrics & Gynecology

## 2021-12-12 DIAGNOSIS — R102 Pelvic and perineal pain: Secondary | ICD-10-CM | POA: Diagnosis present

## 2021-12-12 DIAGNOSIS — N939 Abnormal uterine and vaginal bleeding, unspecified: Secondary | ICD-10-CM | POA: Insufficient documentation

## 2021-12-12 DIAGNOSIS — N946 Dysmenorrhea, unspecified: Secondary | ICD-10-CM | POA: Insufficient documentation

## 2021-12-12 DIAGNOSIS — N941 Unspecified dyspareunia: Secondary | ICD-10-CM | POA: Diagnosis present

## 2021-12-16 ENCOUNTER — Telehealth: Payer: Self-pay

## 2021-12-16 ENCOUNTER — Other Ambulatory Visit: Payer: Self-pay | Admitting: Obstetrics & Gynecology

## 2021-12-16 MED ORDER — MEGESTROL ACETATE 40 MG PO TABS: 40.0000 mg | ORAL_TABLET | Freq: Every day | ORAL | 3 refills | Status: AC

## 2021-12-16 NOTE — Telephone Encounter (Signed)
Returned pt's call, two identifiers used. Informed pt of Dr Lawana Chambers note regarding Megace. Pt had some questions and I informed her that I would reach out to Ozan to answer those. Pt confirmed understanding.

## 2021-12-16 NOTE — Progress Notes (Signed)
Discontinue patch and transition to Megace until surgery can be scheduled. Message sent through Mychart to patient with this information

## 2021-12-16 NOTE — Telephone Encounter (Signed)
PT CALLED AND STATED THAT DR. OZAN TOLD HER TO CALL IF HER BLEEDING CONTINUED.   THE BLEEDING HAS NOT STOPPED AND SHE WANTS TO BE ADVISED ON WHAT TO DO.  I OFFERED TO SCHEDULE AND APPOINTMENT. BUT PT STATED THAT SHE WANTS TO SPEAK WITH A NURSE FIRST.

## 2021-12-24 ENCOUNTER — Encounter: Payer: Self-pay | Admitting: Obstetrics & Gynecology

## 2021-12-29 IMAGING — DX DG CHEST 2V
2 series · 2 of 2 positions shown · non-contrast
Comparison: January 15, 2000

CLINICAL DATA: Chest pain

EXAM:
CHEST - 2 VIEW

[chest pa]
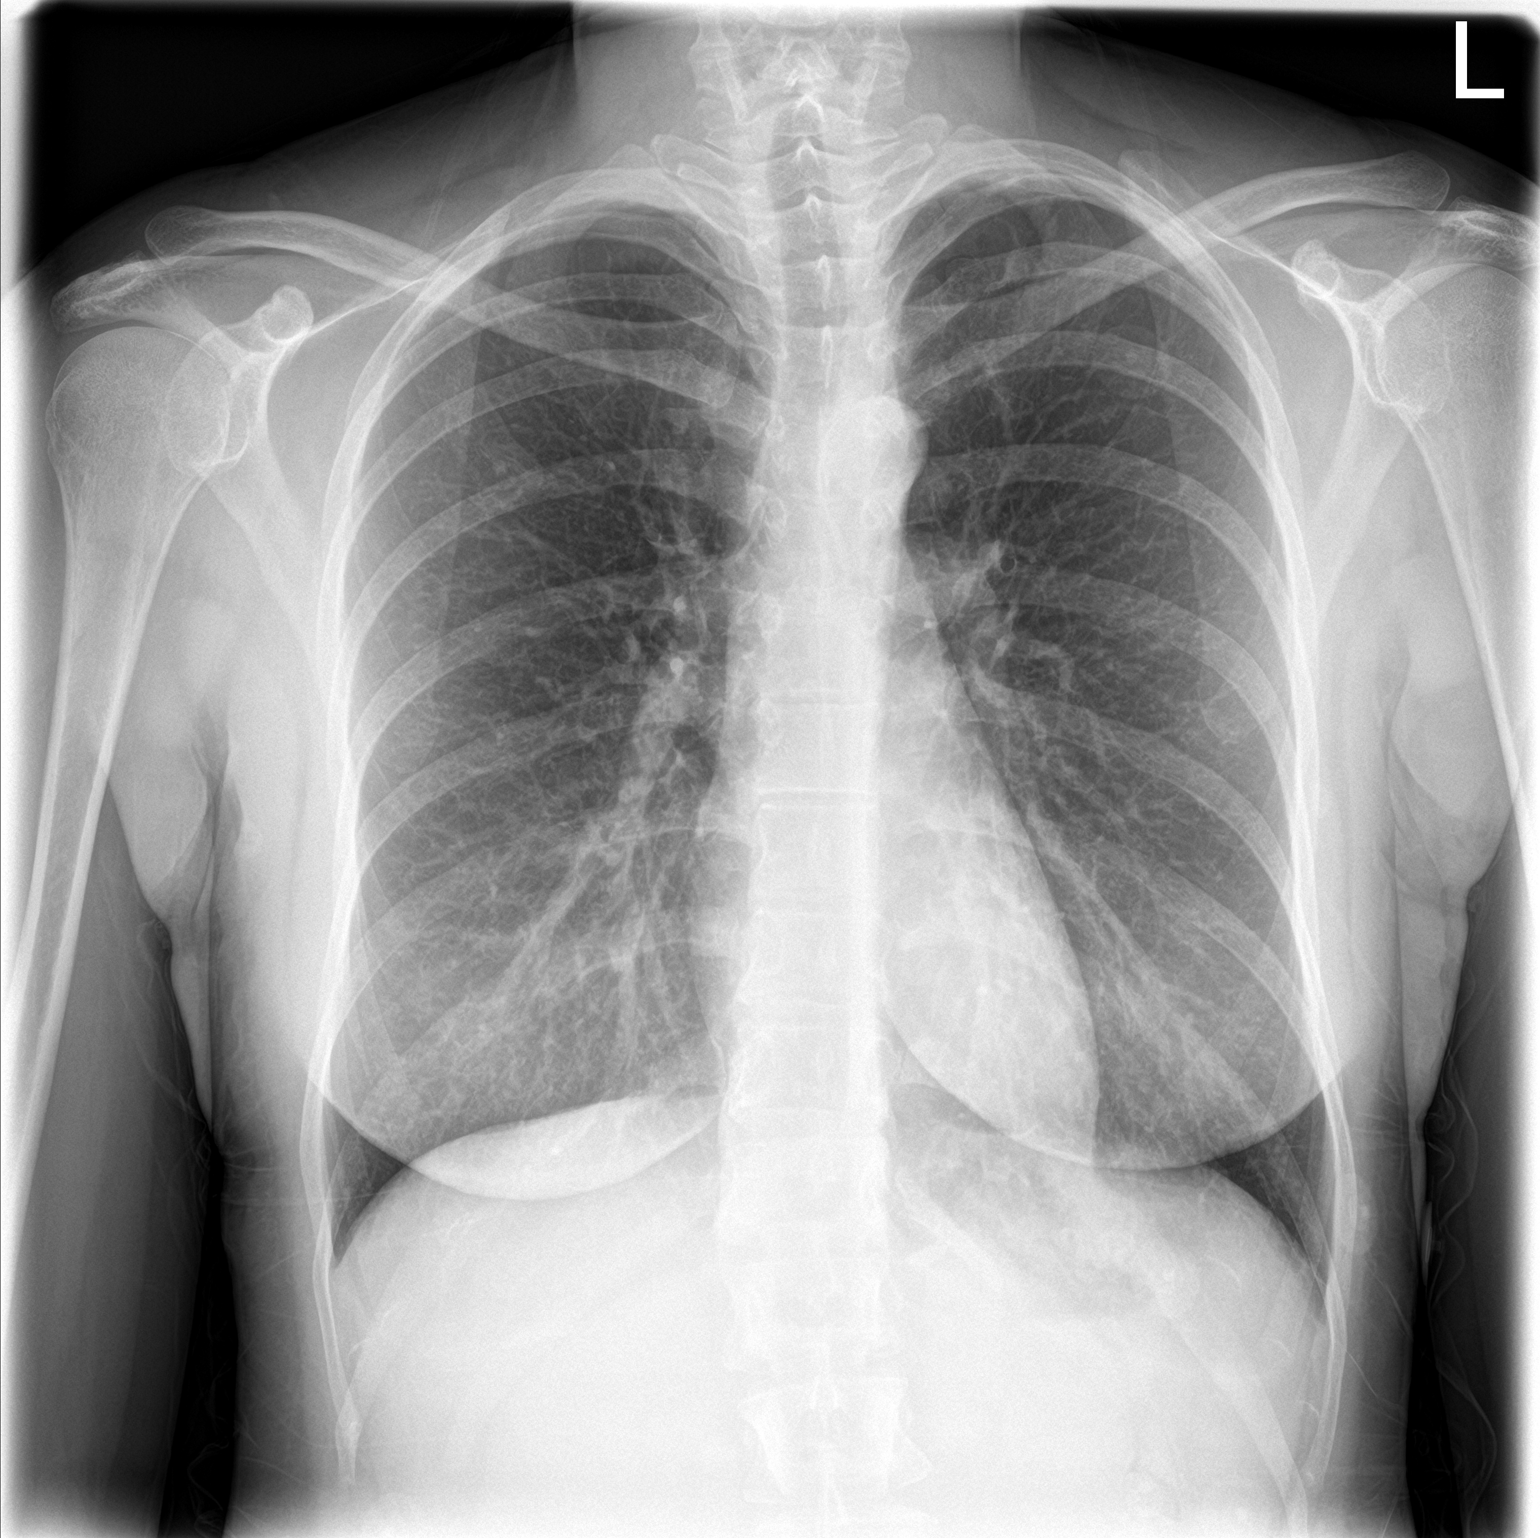

[chest lat]
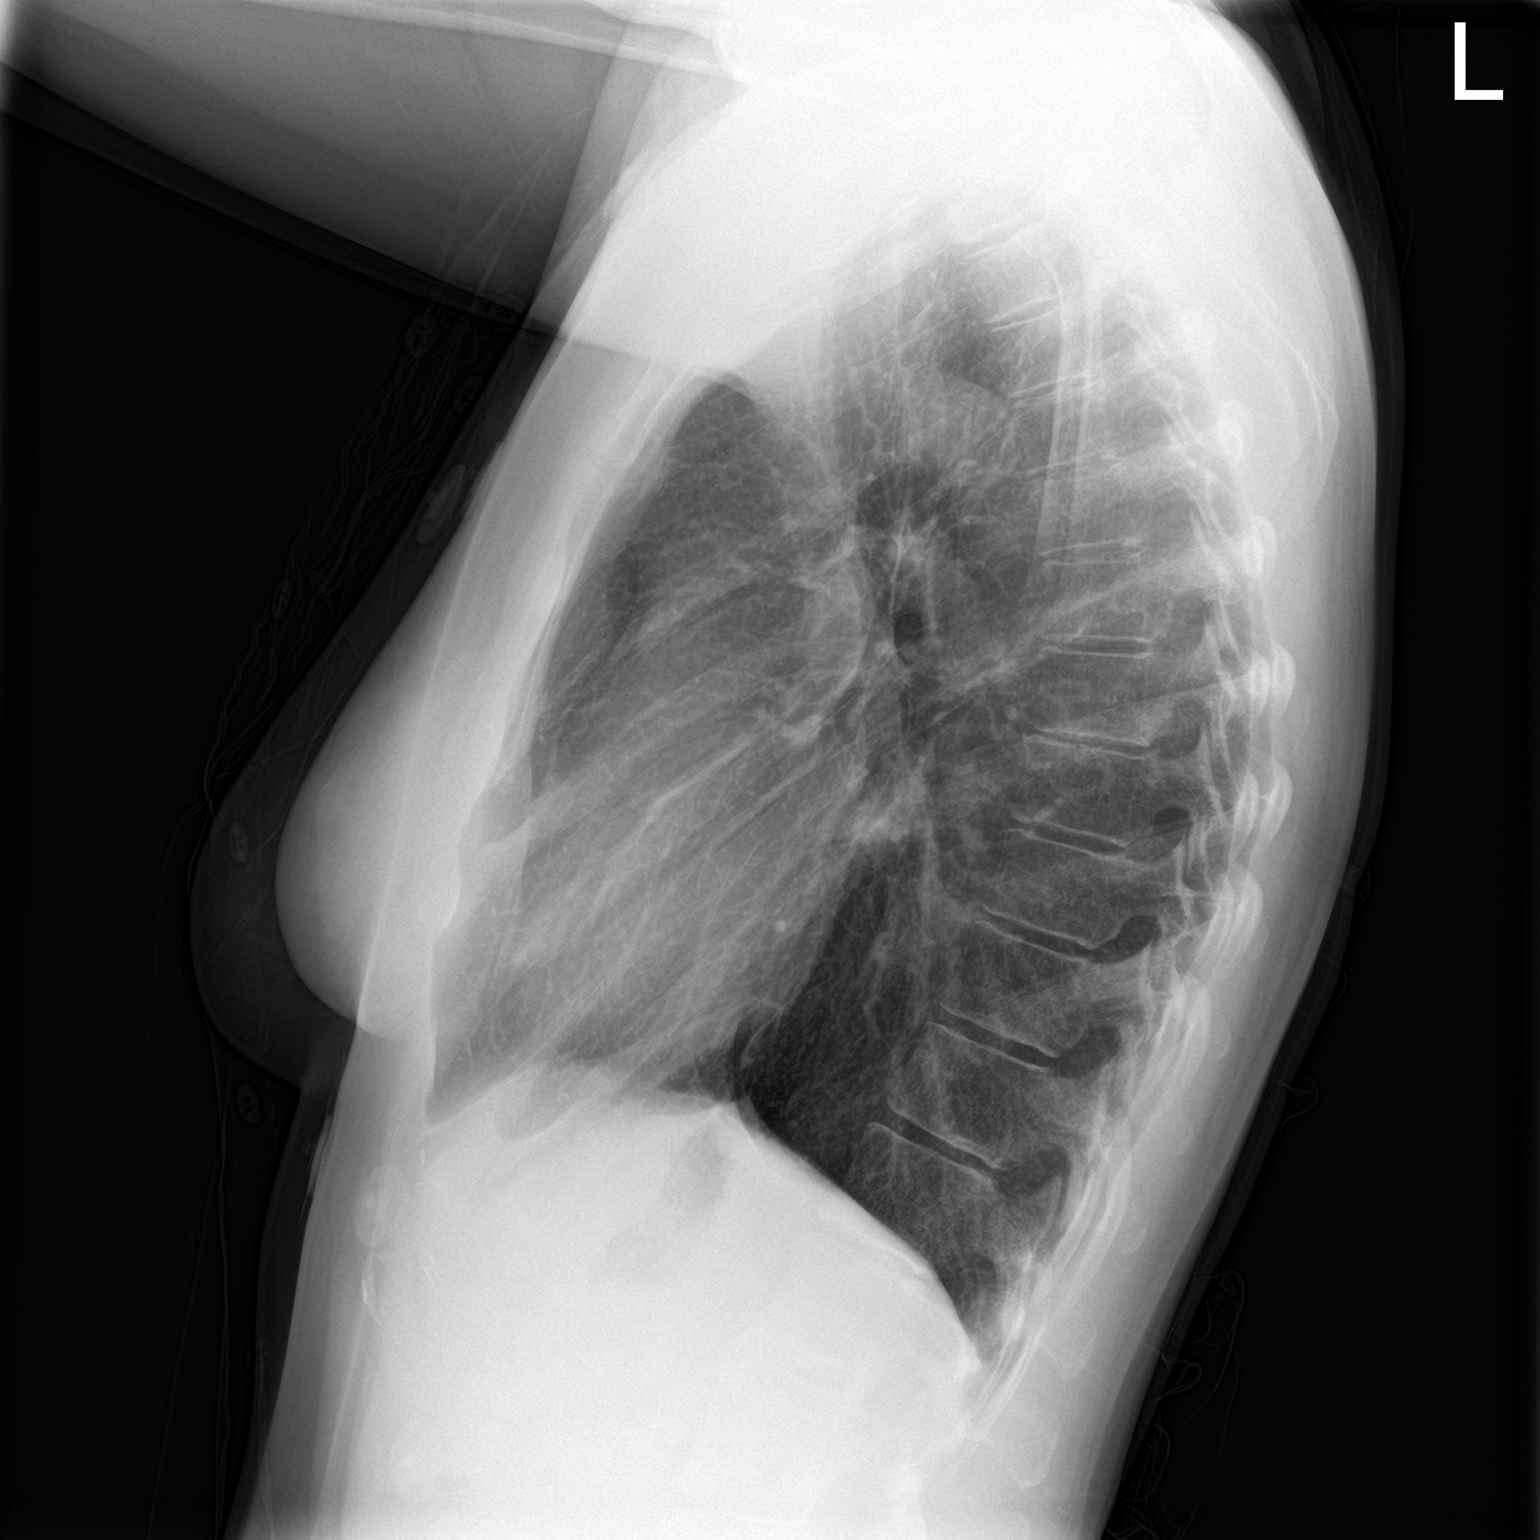

[2 of 2 positions shown; findings below may reference images not displayed]

FINDINGS: The heart size and mediastinal contours are within normal limits.
Both lungs are clear. The visualized skeletal structures are
unremarkable.
IMPRESSION: No active cardiopulmonary disease.

## 2022-01-22 ENCOUNTER — Encounter: Payer: Self-pay | Admitting: *Deleted

## 2022-01-23 NOTE — Patient Instructions (Signed)
Tina Osborn  01/23/2022     @PREFPERIOPPHARMACY @   Your procedure is scheduled on  01/28/2022.   Report to [facility] at  0600 A.M.   Call this number if you have problems the morning of surgery:  [phone]   Remember:  Do not eat or drink after midnight.      Take these medicines the morning of surgery with A SIP OF WATER                         lexapro,toprol, protonix.     Do not wear jewelry, make-up or nail polish.  Do not wear lotions, powders, or perfumes, or deodorant.  Do not shave 48 hours prior to surgery.  Men may shave face and neck.  Do not bring valuables to the hospital.  [facility] is not responsible for any belongings or valuables.  Contacts, dentures or bridgework may not be worn into surgery.  Leave your suitcase in the car.  After surgery it may be brought to your room.  For patients admitted to the hospital, discharge time will be determined by your treatment team.  Patients discharged the day of surgery will not be allowed to drive home and must have someone with them for 24 hours.    Special instructions:   DO NOT smoke tobacco or vape for 24 hours before your procedure.  Please read over the following fact sheets that you were given. Coughing and Deep Breathing, Blood Transfusion Information, Surgical Site Infection Prevention, Anesthesia Post-op Instructions, and Care and Recovery After Surgery      Laparoscopically Assisted Vaginal Hysterectomy, Care After The following information offers guidance on how to care for yourself after your procedure. Your health care provider may also give you more specific instructions. If you have problems or questions, contact your health care provider. What can I expect after the procedure? After the procedure, it is common to have: Soreness and numbness in your incision areas. Abdominal pain. You will be given pain medicine to control it. Vaginal bleeding and discharge. You will need to  use a sanitary pad after this procedure. Tiredness (fatigue). Poor appetite. Less interest in sex. Feelings of sadness or other emotions. If your ovaries were also removed, it is common to have symptoms of menopause, such as hot flashes, night sweats, and lack of sleep (insomnia). Follow these instructions at home: Medicines Take over-the-counter and prescription medicines only as told by your health care provider. Do not take aspirin or NSAIDs, such as ibuprofen. These medicines can cause bleeding. Ask your health care provider if the medicine prescribed to you: Requires you to avoid driving or using heavy machinery. Can cause constipation. You may need to take these actions to prevent or treat constipation: Drink enough fluid to keep your urine pale yellow. Take over-the-counter or prescription medicines. Eat foods that are high in fiber, such as beans, whole grains, and fresh fruits and vegetables. Limit foods that are high in fat and processed sugars, such as fried or sweet foods. Incision care  Follow instructions from your health care provider about how to take care of your incisions. Make sure you: Wash your hands with soap and water for at least 20 seconds before and after you change your bandage (dressing). If soap and water are not available, use hand sanitizer. Change your dressing as told by your health care provider. Leave stitches (sutures), skin glue, or adhesive strips in place.  These skin closures may need to stay in place for 2 weeks or longer. If adhesive strip edges start to loosen and curl up, you may trim the loose edges. Do not remove adhesive strips completely unless your health care provider tells you to do that. Check your incision areas every day for signs of infection. Check for: More redness, swelling, or pain. Fluid or blood. Warmth. Pus or a bad smell. Activity  Rest as told by your healthcare provider. Return to your normal activities as told by your  health care provider. Ask your health care provider what activities are safe for you. Avoid sitting for a long time without moving. Get up to take short walks every 1-2 hours. This is important to improve blood flow and breathing. Ask for help if you feel weak or unsteady. Do not lift anything that is heavier than 10 lb (4.5 kg), or the limit that you are told, until your health care provider says that it is safe. If you were given a sedative during the procedure, it can affect you for several hours. Do not drive or operate machinery until your health care provider says that it is safe. Lifestyle Do not use any products that contain nicotine or tobacco. These products include cigarettes, chewing tobacco, and vaping devices, such as e-cigarettes. These can delay healing after surgery. If you need help quitting, ask your health care provider. Do not drink alcohol until your health care provider approves. General instructions  Do not douche, use tampons, or have sex for at least 6 weeks, or as told by your health care provider. If you struggle with physical or emotional changes after your procedure, speak with your health care provider or a therapist. Do not take baths, swim, or use a hot tub until your health care provider approves. You may only be allowed to take showers for 2-3 weeks. Keep your dressing dry until your health care provider says it can be removed. Try to have someone at home with you for the first 1-2 weeks to help with your daily chores. Wear compression stockings as told by your health care provider. These stockings help to prevent blood clots and reduce swelling in your legs. Keep all follow-up visits. This is important. Contact a health care provider if: You have any of these signs of infection: More redness, swelling, warmth, or pain around an incision. Fluid or blood coming from an incision. Pus or a bad smell coming from an incision. Chills or a fever. An incision  opens. Your pain medicine is not helping. You feel dizzy or light-headed. You have pain or bleeding when you urinate. You have nausea and vomiting that does not go away. You have pus, or a bad-smelling discharge coming from your vagina. Get help right away if: You have a fever and your symptoms suddenly get worse. You have severe abdominal pain. You have chest pain. You have shortness of breath. You faint. You have pain, swelling, or redness in your leg. You have heavy vaginal bleeding and blood clots, soaking through a sanitary pad in less than 1 hour. These symptoms may represent a serious problem that is an emergency. Do not wait to see if the symptoms will go away. Get medical help right away. Call your local emergency services (911 in the [place] Do not drive yourself to the hospital. Summary After the procedure, it is common to have abdominal pain and vaginal bleeding. Wear a sanitary pad for vaginal discharge or bleeding. You should not drive or  lift heavy objects until your health care provider says that it is safe. Contact your health care provider if you have any symptoms of infection, heavy vaginal bleeding, nausea, vomiting, or shortness of breath. This information is not intended to replace advice given to you by your health care provider. Make sure you discuss any questions you have with your health care provider. Document Revised: 03/08/2020 Document Reviewed: 03/08/2020 Elsevier Patient Education  2023 Else[employer] Inc. General Anesthesia, Adult, Care After This sheet gives you information about how to care for yourself after your procedure. Your health care provider may also give you more specific instructions. If you have problems or questions, contact your health care provider. What can I expect after the procedure? After the procedure, the following side effects are common: Pain or discomfort at the IV site. Nausea. Vomiting. Sore throat. Trouble concentrating. Feeling  cold or chills. Feeling weak or tired. Sleepiness and fatigue. Soreness and body aches. These side effects can affect parts of the body that were not involved in surgery. Follow these instructions at home: For the time period you were told by your health care provider:  Rest. Do not participate in activities where you could fall or become injured. Do not drive or use machinery. Do not drink alcohol. Do not take sleeping pills or medicines that cause drowsiness. Do not make important decisions or sign legal documents. Do not take care of children on your own. Eating and drinking Follow any instructions from your health care provider about eating or drinking restrictions. When you feel hungry, start by eating small amounts of foods that are soft and easy to digest (bland), such as toast. Gradually return to your regular diet. Drink enough fluid to keep your urine pale yellow. If you vomit, rehydrate by drinking water, juice, or clear broth. General instructions If you have sleep apnea, surgery and certain medicines can increase your risk for breathing problems. Follow instructions from your health care provider about wearing your sleep device: Anytime you are sleeping, including during daytime naps. While taking prescription pain medicines, sleeping medicines, or medicines that make you drowsy. Have a responsible adult stay with you for the time you are told. It is important to have someone help care for you until you are awake and alert. Return to your normal activities as told by your health care provider. Ask your health care provider what activities are safe for you. Take over-the-counter and prescription medicines only as told by your health care provider. If you smoke, do not smoke without supervision. Keep all follow-up visits as told by your health care provider. This is important. Contact a health care provider if: You have nausea or vomiting that does not get better with  medicine. You cannot eat or drink without vomiting. You have pain that does not get better with medicine. You are unable to pass urine. You develop a skin rash. You have a fever. You have redness around your IV site that gets worse. Get help right away if: You have difficulty breathing. You have chest pain. You have blood in your urine or stool, or you vomit blood. Summary After the procedure, it is common to have a sore throat or nausea. It is also common to feel tired. Have a responsible adult stay with you for the time you are told. It is important to have someone help care for you until you are awake and alert. When you feel hungry, start by eating small amounts of foods that are soft and easy  to digest (bland), such as toast. Gradually return to your regular diet. Drink enough fluid to keep your urine pale yellow. Return to your normal activities as told by your health care provider. Ask your health care provider what activities are safe for you. This information is not intended to replace advice given to you by your health care provider. Make sure you discuss any questions you have with your health care provider. Document Revised: 03/21/2020 Document Reviewed: 10/19/2019 Elsevier Patient Education  2023 Elsevier Inc. How to Use Chlorhexidine for Bathing Chlorhexidine gluconate (CHG) is a germ-killing (antiseptic) solution that is used to clean the skin. It can get rid of the bacteria that normally live on the skin and can keep them away for about 24 hours. To clean your skin with CHG, you may be given: A CHG solution to use in the shower or as part of a sponge bath. A prepackaged cloth that contains CHG. Cleaning your skin with CHG may help lower the risk for infection: While you are staying in the intensive care unit of the hospital. If you have a vascular access, such as a central line, to provide short-term or long-term access to your veins. If you have a catheter to drain urine  from your bladder. If you are on a ventilator. A ventilator is a machine that helps you breathe by moving air in and out of your lungs. After surgery. What are the risks? Risks of using CHG include: A skin reaction. Hearing loss, if CHG gets in your ears and you have a perforated eardrum. Eye injury, if CHG gets in your eyes and is not rinsed out. The CHG product catching fire. Make sure that you avoid smoking and flames after applying CHG to your skin. Do not use CHG: If you have a chlorhexidine allergy or have previously reacted to chlorhexidine. On babies younger than 42 months of age. How to use CHG solution Use CHG only as told by your health care provider, and follow the instructions on the label. Use the full amount of CHG as directed. Usually, this is one bottle. During a shower Follow these steps when using CHG solution during a shower (unless your health care provider gives you different instructions): Start the shower. Use your normal soap and shampoo to wash your face and hair. Turn off the shower or move out of the shower stream. Pour the CHG onto a clean washcloth. Do not use any type of brush or rough-edged sponge. Starting at your neck, lather your body down to your toes. Make sure you follow these instructions: If you will be having surgery, pay special attention to the part of your body where you will be having surgery. Scrub this area for at least 1 minute. Do not use CHG on your head or face. If the solution gets into your ears or eyes, rinse them well with water. Avoid your genital area. Avoid any areas of skin that have broken skin, cuts, or scrapes. Scrub your back and under your arms. Make sure to wash skin folds. Let the lather sit on your skin for 1-2 minutes or as long as told by your health care provider. Thoroughly rinse your entire body in the shower. Make sure that all body creases and crevices are rinsed well. Dry off with a clean towel. Do not put any  substances on your body afterward--such as powder, lotion, or perfume--unless you are told to do so by your health care provider. Only use lotions that are recommended by  the manufacturer. Put on clean clothes or pajamas. If it is the night before your surgery, sleep in clean sheets.  During a sponge bath Follow these steps when using CHG solution during a sponge bath (unless your health care provider gives you different instructions): Use your normal soap and shampoo to wash your face and hair. Pour the CHG onto a clean washcloth. Starting at your neck, lather your body down to your toes. Make sure you follow these instructions: If you will be having surgery, pay special attention to the part of your body where you will be having surgery. Scrub this area for at least 1 minute. Do not use CHG on your head or face. If the solution gets into your ears or eyes, rinse them well with water. Avoid your genital area. Avoid any areas of skin that have broken skin, cuts, or scrapes. Scrub your back and under your arms. Make sure to wash skin folds. Let the lather sit on your skin for 1-2 minutes or as long as told by your health care provider. Using a different clean, wet washcloth, thoroughly rinse your entire body. Make sure that all body creases and crevices are rinsed well. Dry off with a clean towel. Do not put any substances on your body afterward--such as powder, lotion, or perfume--unless you are told to do so by your health care provider. Only use lotions that are recommended by the manufacturer. Put on clean clothes or pajamas. If it is the night before your surgery, sleep in clean sheets. How to use CHG prepackaged cloths Only use CHG cloths as told by your health care provider, and follow the instructions on the label. Use the CHG cloth on clean, dry skin. Do not use the CHG cloth on your head or face unless your health care provider tells you to. When washing with the CHG cloth: Avoid  your genital area. Avoid any areas of skin that have broken skin, cuts, or scrapes. Before surgery Follow these steps when using a CHG cloth to clean before surgery (unless your health care provider gives you different instructions): Using the CHG cloth, vigorously scrub the part of your body where you will be having surgery. Scrub using a back-and-forth motion for 3 minutes. The area on your body should be completely wet with CHG when you are done scrubbing. Do not rinse. Discard the cloth and let the area air-dry. Do not put any substances on the area afterward, such as powder, lotion, or perfume. Put on clean clothes or pajamas. If it is the night before your surgery, sleep in clean sheets.  For general bathing Follow these steps when using CHG cloths for general bathing (unless your health care provider gives you different instructions). Use a separate CHG cloth for each area of your body. Make sure you wash between any folds of skin and between your fingers and toes. Wash your body in the following order, switching to a new cloth after each step: The front of your neck, shoulders, and chest. Both of your arms, under your arms, and your hands. Your stomach and groin area, avoiding the genitals. Your right leg and foot. Your left leg and foot. The back of your neck, your back, and your buttocks. Do not rinse. Discard the cloth and let the area air-dry. Do not put any substances on your body afterward--such as powder, lotion, or perfume--unless you are told to do so by your health care provider. Only use lotions that are recommended by the manufacturer. Put  on clean clothes or pajamas. Contact a health care provider if: Your skin gets irritated after scrubbing. You have questions about using your solution or cloth. You swallow any chlorhexidine. Call your local poison control center ([phone] in the [place] Get help right away if: Your eyes itch badly, or they become very red or  swollen. Your skin itches badly and is red or swollen. Your hearing changes. You have trouble seeing. You have swelling or tingling in your mouth or throat. You have trouble breathing. These symptoms may represent a serious problem that is an emergency. Do not wait to see if the symptoms will go away. Get medical help right away. Call your local emergency services (911 in the [place] Do not drive yourself to the hospital. Summary Chlorhexidine gluconate (CHG) is a germ-killing (antiseptic) solution that is used to clean the skin. Cleaning your skin with CHG may help to lower your risk for infection. You may be given CHG to use for bathing. It may be in a bottle or in a prepackaged cloth to use on your skin. Carefully follow your health care provider's instructions and the instructions on the product label. Do not use CHG if you have a chlorhexidine allergy. Contact your health care provider if your skin gets irritated after scrubbing. This information is not intended to replace advice given to you by your health care provider. Make sure you discuss any questions you have with your health care provider. Document Revised: 09/16/2020 Document Reviewed: 09/16/2020 Elsevier Patient Education  2023 [employer]

## 2022-01-26 ENCOUNTER — Other Ambulatory Visit: Payer: Self-pay

## 2022-01-26 ENCOUNTER — Encounter (HOSPITAL_COMMUNITY)
Admission: RE | Admit: 2022-01-26 | Discharge: 2022-01-26 | Disposition: A | Discharge status: Home / Self Care | Payer: BC Managed Care – PPO | Source: Ambulatory Visit | Attending: Obstetrics & Gynecology | Admitting: Obstetrics & Gynecology

## 2022-01-26 ENCOUNTER — Encounter (HOSPITAL_COMMUNITY): Payer: Self-pay

## 2022-01-26 VITALS — BP 121/84 | HR 94 | Temp 97.7°F | Ht 66.0 in | Wt 156.0 lb

## 2022-01-26 DIAGNOSIS — N941 Unspecified dyspareunia: Secondary | ICD-10-CM | POA: Diagnosis not present

## 2022-01-26 DIAGNOSIS — I499 Cardiac arrhythmia, unspecified: Secondary | ICD-10-CM

## 2022-01-26 DIAGNOSIS — N939 Abnormal uterine and vaginal bleeding, unspecified: Secondary | ICD-10-CM

## 2022-01-26 DIAGNOSIS — K219 Gastro-esophageal reflux disease without esophagitis: Secondary | ICD-10-CM | POA: Diagnosis not present

## 2022-01-26 DIAGNOSIS — Z01818 Encounter for other preprocedural examination: Secondary | ICD-10-CM | POA: Insufficient documentation

## 2022-01-26 DIAGNOSIS — Z79899 Other long term (current) drug therapy: Secondary | ICD-10-CM | POA: Diagnosis not present

## 2022-01-26 DIAGNOSIS — R102 Pelvic and perineal pain: Secondary | ICD-10-CM | POA: Diagnosis not present

## 2022-01-26 DIAGNOSIS — N946 Dysmenorrhea, unspecified: Secondary | ICD-10-CM | POA: Diagnosis not present

## 2022-01-26 DIAGNOSIS — N8003 Adenomyosis of the uterus: Secondary | ICD-10-CM | POA: Diagnosis not present

## 2022-01-26 DIAGNOSIS — N72 Inflammatory disease of cervix uteri: Secondary | ICD-10-CM | POA: Diagnosis not present

## 2022-01-26 DIAGNOSIS — F419 Anxiety disorder, unspecified: Secondary | ICD-10-CM | POA: Diagnosis not present

## 2022-01-26 HISTORY — DX: Gastro-esophageal reflux disease without esophagitis: K21.9

## 2022-01-26 HISTORY — DX: Anxiety disorder, unspecified: F41.9

## 2022-01-26 HISTORY — DX: Cardiac arrhythmia, unspecified: I49.9

## 2022-01-26 LAB — CBC
HCT: 41.8 % (ref 36.0–46.0)
Hemoglobin: 13.9 g/dL (ref 12.0–15.0)
MCH: 29.8 pg (ref 26.0–34.0)
MCHC: 33.3 g/dL (ref 30.0–36.0)
MCV: 89.5 fL (ref 80.0–100.0)
Platelets: 208 10*3/uL (ref 150–400)
RBC: 4.67 MIL/uL (ref 3.87–5.11)
RDW: 13.2 % (ref 11.5–15.5)
WBC: 6.4 10*3/uL (ref 4.0–10.5)
nRBC: 0 % (ref 0.0–0.2)

## 2022-01-26 LAB — BASIC METABOLIC PANEL
Anion gap: 8 (ref 5–15)
BUN: 18 mg/dL (ref 6–20)
CO2: 23 mmol/L (ref 22–32)
Calcium: 9.6 mg/dL (ref 8.9–10.3)
Chloride: 108 mmol/L (ref 98–111)
Creatinine, Ser: 0.85 mg/dL (ref 0.44–1.00)
GFR, Estimated: >60 mL/min (ref >60)
Glucose, Bld: 69 mg/dL — ABNORMAL LOW (ref 70–99)
Potassium: 3.6 mmol/L (ref 3.5–5.1)
Sodium: 139 mmol/L (ref 135–145)

## 2022-01-26 LAB — PREGNANCY, URINE: Preg Test, Ur: NEGATIVE

## 2022-01-27 LAB — TYPE AND SCREEN
ABO/RH(D): B POS
Antibody Screen: NEGATIVE

## 2022-01-27 NOTE — H&P (Signed)
[facility] Attending History and Physical  Tina Osborn is a 36 y.o. [id number] who presents for scheduled LAVH, BS due to abnormal uterine bleeding.  In review, she has struggled with irregular periods for over 6 mos.  Menses may last up to 10 days with 3-4 pads per day.  She has tried management with medication; however, her periods continue to be heavy.  She also notes dysmenorrhea, dyspareunia, dyschezia and now pelvic pain leading up to her menses.    She does not want any more children and desires permanent intervention for her menses.  Due to concern for possible endometriosis- plan was made for LAVH to explore abdominal cavity.  Denies any abnormal vaginal discharge, fevers, chills, sweats, dysuria, nausea, vomiting, other GI or GU symptoms or other general symptoms.  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  No current facility-administered medications on file prior to encounter.   Current Outpatient Medications on File Prior to Encounter  Medication Sig Dispense Refill   escitalopram (LEXAPRO) 5 MG tablet Take 5 mg by mouth daily.     ibuprofen (ADVIL) 600 MG tablet Take 1 tablet (600 mg total) by mouth every 6 (six) hours as needed for moderate pain or cramping. 30 tablet 3   megestrol (MEGACE) 40 MG tablet Take 40 mg by mouth daily.      metoprolol succinate (TOPROL-XL) 100 MG 24 hr tablet Take 100 mg by mouth daily.     pantoprazole (PROTONIX) 40 MG tablet Take 40 mg by mouth daily.     Allergies  Allergen Reactions   Hydrocortisone Rash    cream    Social History:   reports that she has never smoked. She has never used smokeless tobacco. She reports that she does not drink alcohol and does not use drugs. Family History  Problem Relation Age of Onset   Cancer Paternal Grandmother        breast   Hypertension Father    Cancer Father        skin   Cancer Mother        ovarian    Review of Systems: Pertinent items noted in HPI and remainder of comprehensive ROS otherwise negative.  PHYSICAL EXAM: *** Last menstrual period 10/08/2021. CONSTITUTIONAL: Well-developed, well-nourished female in no acute distress.  SKIN: Skin is warm and dry. No rash noted. Not diaphoretic. No erythema. No pallor. NEUROLOGIC: Alert and oriented to person, place, and time. Normal reflexes, muscle tone coordination. No cranial nerve deficit noted. PSYCHIATRIC: Normal mood and affect. Normal behavior. Normal judgment and thought content. CARDIOVASCULAR: Normal heart rate noted, regular rhythm RESPIRATORY: Effort and breath sounds normal, no problems with respiration  noted ABDOMEN: Soft, nontender, nondistended. PELVIC: deferred MUSCULOSKELETAL: no calf tenderness bilaterally EXT: no edema bilaterally, normal pulses  Labs: Results for orders placed or performed during the hospital encounter of 01/26/22 (from the past 336 hour(s))  CBC   Collection Time: 01/26/22  1:50 PM  Result Value Ref Range   WBC 6.4 4.0 - 10.5 K/uL   RBC 4.67 3.87 - 5.11 MIL/uL   Hemoglobin 13.9 12.0 - 15.0 g/dL   HCT 49.7 02.6 - 37.8 %   MCV 89.5 80.0 - 100.0 fL   MCH 29.8 26.0 - 34.0 pg   MCHC 33.3 30.0 - 36.0 g/dL   RDW 58.8 50.2 - 77.4 %   Platelets 208 150 - 400 K/uL   nRBC 0.0 0.0 - 0.2 %  Basic metabolic panel   Collection Time: 01/26/22  1:50  PM  Result Value Ref Range   Sodium 139 135 - 145 mmol/L   Potassium 3.6 3.5 - 5.1 mmol/L   Chloride 108 98 - 111 mmol/L   CO2 23 22 - 32 mmol/L   Glucose, Bld 69 (L) 70 - 99 mg/dL   BUN 18 6 - 20 mg/dL   Creatinine, Ser 1.28 0.44 - 1.00 mg/dL   Calcium 9.6 8.9 - 78.6 mg/dL   GFR, Estimated >76 >72 mL/min   Anion gap 8 5 - 15  Type and screen [facility]   Collection Time: 01/26/22  1:50 PM  Result Value Ref Range   ABO/RH(D) B POS    Antibody Screen NEG    Sample Expiration      02/09/2022,2359 Performed at [facility], [address], [place], [place] [address]   Pregnancy, urine   Collection Time: 01/26/22  3:10 PM  Result Value Ref Range   Preg Test, Ur NEGATIVE NEGATIVE    Imaging Studies: 11/2021: Measurements: 7.8 x 5.3 x 6.7 cm = volume: 144.3 mL. No fibroids or other mass visualized. Uterus is retroverted.  Right ovary not seen.  Normal left ovary.  Concern for possible pelvic venous congestion.  Assessment: Abnormal uterine bleeding   Plan: Laparoscopic assisted vaginal hysterectomy and bilateral salpingectomy -Ancef 2g IV -NPO -LR @ 125cc/hr -SCDs to OR -preop Toradol -Risk/benefits and alternatives reviewed with the patient including but not limited to risk of bleeding, infection and injury to surrounding organs.  Questions and concerns were addressed and pt desires to proceed  Myna Hidalgo, DO Attending Obstetrician & Gynecologist, [facility] for [facility], [facility]

## 2022-01-28 ENCOUNTER — Ambulatory Visit (HOSPITAL_COMMUNITY): Payer: BC Managed Care – PPO

## 2022-01-28 ENCOUNTER — Encounter (HOSPITAL_COMMUNITY): Payer: Self-pay | Admitting: Obstetrics & Gynecology

## 2022-01-28 ENCOUNTER — Encounter (HOSPITAL_COMMUNITY)
Admission: RE | Disposition: A | Discharge status: Home / Self Care | Payer: Self-pay | Source: Home / Self Care | Attending: Obstetrics & Gynecology

## 2022-01-28 ENCOUNTER — Other Ambulatory Visit: Payer: Self-pay

## 2022-01-28 ENCOUNTER — Ambulatory Visit (HOSPITAL_COMMUNITY)
Admission: RE | Admit: 2022-01-28 | Discharge: 2022-01-28 | Disposition: A | Discharge status: Home / Self Care | Payer: BC Managed Care – PPO | Attending: Obstetrics & Gynecology | Admitting: Obstetrics & Gynecology

## 2022-01-28 DIAGNOSIS — F419 Anxiety disorder, unspecified: Secondary | ICD-10-CM | POA: Insufficient documentation

## 2022-01-28 DIAGNOSIS — R768 Other specified abnormal immunological findings in serum: Secondary | ICD-10-CM

## 2022-01-28 DIAGNOSIS — N946 Dysmenorrhea, unspecified: Secondary | ICD-10-CM | POA: Diagnosis not present

## 2022-01-28 DIAGNOSIS — N72 Inflammatory disease of cervix uteri: Secondary | ICD-10-CM | POA: Insufficient documentation

## 2022-01-28 DIAGNOSIS — N939 Abnormal uterine and vaginal bleeding, unspecified: Secondary | ICD-10-CM | POA: Insufficient documentation

## 2022-01-28 DIAGNOSIS — Z79899 Other long term (current) drug therapy: Secondary | ICD-10-CM | POA: Insufficient documentation

## 2022-01-28 DIAGNOSIS — N941 Unspecified dyspareunia: Secondary | ICD-10-CM | POA: Insufficient documentation

## 2022-01-28 DIAGNOSIS — R102 Pelvic and perineal pain: Secondary | ICD-10-CM | POA: Insufficient documentation

## 2022-01-28 DIAGNOSIS — N854 Malposition of uterus: Secondary | ICD-10-CM

## 2022-01-28 DIAGNOSIS — Z01818 Encounter for other preprocedural examination: Secondary | ICD-10-CM

## 2022-01-28 DIAGNOSIS — N8003 Adenomyosis of the uterus: Secondary | ICD-10-CM | POA: Insufficient documentation

## 2022-01-28 DIAGNOSIS — K219 Gastro-esophageal reflux disease without esophagitis: Secondary | ICD-10-CM | POA: Insufficient documentation

## 2022-01-28 HISTORY — PX: LAPAROSCOPIC BILATERAL SALPINGECTOMY: SHX5889

## 2022-01-28 HISTORY — PX: LAPAROSCOPIC ASSISTED VAGINAL HYSTERECTOMY: SHX5398

## 2022-01-28 LAB — ABO/RH: ABO/RH(D): B POS

## 2022-01-28 SURGERY — HYSTERECTOMY, VAGINAL, LAPAROSCOPY-ASSISTED
Anesthesia: General | Site: Abdomen

## 2022-01-28 MED ORDER — GABAPENTIN 300 MG PO CAPS: 300.0000 mg | ORAL_CAPSULE | Freq: Three times a day (TID) | ORAL | 0 refills | Status: DC

## 2022-01-28 MED ORDER — PROPOFOL 10 MG/ML IV BOLUS
INTRAVENOUS | Status: AC
  Filled 2022-01-28: qty 20

## 2022-01-28 MED ORDER — MIDAZOLAM HCL 2 MG/2ML IJ SOLN
INTRAMUSCULAR | Status: AC
  Filled 2022-01-28: qty 2

## 2022-01-28 MED ORDER — FENTANYL CITRATE (PF) 100 MCG/2ML IJ SOLN
INTRAMUSCULAR | Status: AC
  Filled 2022-01-28: qty 2

## 2022-01-28 MED ORDER — LIDOCAINE HCL (PF) 2 % IJ SOLN
INTRAMUSCULAR | Status: AC
  Filled 2022-01-28: qty 5

## 2022-01-28 MED ORDER — PROPOFOL 10 MG/ML IV BOLUS
INTRAVENOUS | Status: DC | PRN
  Administered 2022-01-28: 120 mg via INTRAVENOUS

## 2022-01-28 MED ORDER — BUPIVACAINE-EPINEPHRINE (PF) 0.5% -1:200000 IJ SOLN
INTRAMUSCULAR | Status: AC
  Filled 2022-01-28: qty 30

## 2022-01-28 MED ORDER — ORAL CARE MOUTH RINSE: 15.0000 mL | Freq: Once | OROMUCOSAL | Status: DC

## 2022-01-28 MED ORDER — SUGAMMADEX SODIUM 200 MG/2ML IV SOLN
INTRAVENOUS | Status: DC | PRN
  Administered 2022-01-28: 200 mg via INTRAVENOUS

## 2022-01-28 MED ORDER — POVIDONE-IODINE 10 % EX SWAB: 2.0000 | Freq: Once | CUTANEOUS | Status: DC

## 2022-01-28 MED ORDER — ONDANSETRON HCL 4 MG PO TABS: 4.0000 mg | ORAL_TABLET | Freq: Three times a day (TID) | ORAL | 0 refills | Status: DC | PRN

## 2022-01-28 MED ORDER — MIDAZOLAM HCL 5 MG/5ML IJ SOLN
INTRAMUSCULAR | Status: DC | PRN
  Administered 2022-01-28 (×2): 1 mg via INTRAVENOUS

## 2022-01-28 MED ORDER — LIDOCAINE-EPINEPHRINE (PF) 1 %-1:200000 IJ SOLN
INTRAMUSCULAR | Status: AC
  Filled 2022-01-28: qty 30

## 2022-01-28 MED ORDER — KETOROLAC TROMETHAMINE 15 MG/ML IJ SOLN
15.0000 mg | INTRAMUSCULAR | Status: AC
  Administered 2022-01-28: 15 mg via INTRAVENOUS
  Filled 2022-01-28: qty 1

## 2022-01-28 MED ORDER — CEFAZOLIN SODIUM-DEXTROSE 2-4 GM/100ML-% IV SOLN
2.0000 g | INTRAVENOUS | Status: AC
  Administered 2022-01-28: 2 g via INTRAVENOUS
  Filled 2022-01-28: qty 100

## 2022-01-28 MED ORDER — LACTATED RINGERS IV SOLN: INTRAVENOUS | Status: DC

## 2022-01-28 MED ORDER — LIDOCAINE HCL (CARDIAC) PF 100 MG/5ML IV SOSY
PREFILLED_SYRINGE | INTRAVENOUS | Status: DC | PRN
  Administered 2022-01-28: 80 mg via INTRAVENOUS

## 2022-01-28 MED ORDER — PHENYLEPHRINE 80 MCG/ML (10ML) SYRINGE FOR IV PUSH (FOR BLOOD PRESSURE SUPPORT)
PREFILLED_SYRINGE | INTRAVENOUS | Status: AC
  Filled 2022-01-28: qty 10

## 2022-01-28 MED ORDER — FENTANYL CITRATE (PF) 100 MCG/2ML IJ SOLN
INTRAMUSCULAR | Status: DC | PRN
  Administered 2022-01-28: 100 ug via INTRAVENOUS
  Administered 2022-01-28: 50 ug via INTRAVENOUS

## 2022-01-28 MED ORDER — ROCURONIUM BROMIDE 100 MG/10ML IV SOLN
INTRAVENOUS | Status: DC | PRN
  Administered 2022-01-28: 50 mg via INTRAVENOUS

## 2022-01-28 MED ORDER — KETOROLAC TROMETHAMINE 10 MG PO TABS
10.0000 mg | ORAL_TABLET | Freq: Three times a day (TID) | ORAL | 0 refills | Status: AC
Start: 2022-01-28 — End: 2022-01-31

## 2022-01-28 MED ORDER — DEXAMETHASONE SODIUM PHOSPHATE 10 MG/ML IJ SOLN
INTRAMUSCULAR | Status: AC
  Filled 2022-01-28: qty 1

## 2022-01-28 MED ORDER — SODIUM CHLORIDE (PF) 0.9 % IJ SOLN
INTRAMUSCULAR | Status: AC
  Filled 2022-01-28: qty 10

## 2022-01-28 MED ORDER — ACETAMINOPHEN 10 MG/ML IV SOLN
1000.0000 mg | Freq: Once | INTRAVENOUS | Status: AC
  Administered 2022-01-28: 1000 mg via INTRAVENOUS
  Filled 2022-01-28: qty 100

## 2022-01-28 MED ORDER — OXYCODONE HCL 5 MG PO TABS: 5.0000 mg | ORAL_TABLET | Freq: Four times a day (QID) | ORAL | 0 refills | Status: AC | PRN

## 2022-01-28 MED ORDER — LIDOCAINE HCL 1 % IJ SOLN
INTRAMUSCULAR | Status: DC | PRN
  Administered 2022-01-28: 30 mL

## 2022-01-28 MED ORDER — DEXMEDETOMIDINE HCL IN NACL 80 MCG/20ML IV SOLN
INTRAVENOUS | Status: AC
  Filled 2022-01-28: qty 20

## 2022-01-28 MED ORDER — FENTANYL CITRATE PF 50 MCG/ML IJ SOSY
25.0000 ug | PREFILLED_SYRINGE | INTRAMUSCULAR | Status: DC | PRN
  Administered 2022-01-28 (×2): 50 ug via INTRAVENOUS
  Filled 2022-01-28 (×2): qty 1

## 2022-01-28 MED ORDER — ONDANSETRON HCL 4 MG/2ML IJ SOLN
INTRAMUSCULAR | Status: DC | PRN
  Administered 2022-01-28: 4 mg via INTRAVENOUS

## 2022-01-28 MED ORDER — BUPIVACAINE-EPINEPHRINE (PF) 0.5% -1:200000 IJ SOLN
INTRAMUSCULAR | Status: DC | PRN
  Administered 2022-01-28: 15 mL via PERINEURAL

## 2022-01-28 MED ORDER — ONDANSETRON HCL 4 MG/2ML IJ SOLN: 4.0000 mg | Freq: Once | INTRAMUSCULAR | Status: DC | PRN

## 2022-01-28 MED ORDER — HYDROCODONE-ACETAMINOPHEN 7.5-325 MG PO TABS
1.0000 | ORAL_TABLET | Freq: Once | ORAL | Status: AC | PRN
  Administered 2022-01-28: 1 via ORAL
  Filled 2022-01-28: qty 1

## 2022-01-28 MED ORDER — ROCURONIUM BROMIDE 10 MG/ML (PF) SYRINGE
PREFILLED_SYRINGE | INTRAVENOUS | Status: AC
  Filled 2022-01-28: qty 20

## 2022-01-28 MED ORDER — ACETAMINOPHEN 325 MG PO TABS: 650.0000 mg | ORAL_TABLET | Freq: Four times a day (QID) | ORAL | Status: DC | PRN

## 2022-01-28 MED ORDER — DOCUSATE SODIUM 100 MG PO CAPS: 100.0000 mg | ORAL_CAPSULE | Freq: Two times a day (BID) | ORAL | 0 refills | Status: AC

## 2022-01-28 MED ORDER — GLYCOPYRROLATE PF 0.2 MG/ML IJ SOSY
PREFILLED_SYRINGE | INTRAMUSCULAR | Status: AC
  Filled 2022-01-28: qty 2

## 2022-01-28 MED ORDER — IBUPROFEN 600 MG PO TABS: 600.0000 mg | ORAL_TABLET | Freq: Four times a day (QID) | ORAL | 0 refills | Status: DC | PRN

## 2022-01-28 MED ORDER — ONDANSETRON HCL 4 MG/2ML IJ SOLN
INTRAMUSCULAR | Status: AC
  Filled 2022-01-28: qty 2

## 2022-01-28 MED ORDER — LIDOCAINE HCL (PF) 1 % IJ SOLN
INTRAMUSCULAR | Status: AC
  Filled 2022-01-28: qty 30

## 2022-01-28 MED ORDER — BUPIVACAINE HCL (PF) 0.25 % IJ SOLN
INTRAMUSCULAR | Status: AC
  Filled 2022-01-28: qty 30

## 2022-01-28 MED ORDER — SEVOFLURANE IN SOLN
RESPIRATORY_TRACT | Status: AC
  Filled 2022-01-28: qty 250

## 2022-01-28 MED ORDER — SODIUM CHLORIDE 0.9 % IR SOLN
Status: DC | PRN
  Administered 2022-01-28: 3000 mL
  Administered 2022-01-28 (×2): 1000 mL

## 2022-01-28 MED ORDER — DEXAMETHASONE SODIUM PHOSPHATE 10 MG/ML IJ SOLN
INTRAMUSCULAR | Status: DC | PRN
  Administered 2022-01-28: 10 mg via INTRAVENOUS

## 2022-01-28 MED ORDER — CHLORHEXIDINE GLUCONATE 0.12 % MT SOLN: 15.0000 mL | Freq: Once | OROMUCOSAL | Status: DC

## 2022-01-28 MED ORDER — PHENYLEPHRINE HCL (PRESSORS) 10 MG/ML IV SOLN
INTRAVENOUS | Status: DC | PRN
  Administered 2022-01-28 (×2): 80 ug via INTRAVENOUS

## 2022-01-28 SURGICAL SUPPLY — 61 items
ADH SKN CLS APL DERMABOND .7 (GAUZE/BANDAGES/DRESSINGS) ×2
APPLIER CLIP 5 13 M/L LIGAMAX5 (MISCELLANEOUS) ×3
APR CLP MED LRG 5 ANG JAW (MISCELLANEOUS) ×2
BLADE SURG SZ11 CARB STEEL (BLADE) ×3 IMPLANT
CLIP APPLIE 5 13 M/L LIGAMAX5 (MISCELLANEOUS) IMPLANT
CLOTH BEACON ORANGE TIMEOUT ST (SAFETY) ×3 IMPLANT
COVER BACK TABLE 60X90IN (DRAPES) ×3 IMPLANT
COVER MAYO STAND STRL (DRAPES) ×3 IMPLANT
COVER MAYO STAND XLG (MISCELLANEOUS) ×1 IMPLANT
COVER SURGICAL LIGHT HANDLE (MISCELLANEOUS) ×6 IMPLANT
DERMABOND ADVANCED (GAUZE/BANDAGES/DRESSINGS) ×1
DERMABOND ADVANCED .7 DNX12 (GAUZE/BANDAGES/DRESSINGS) ×2 IMPLANT
DRAPE HALF SHEET 40X57 (DRAPES) ×2 IMPLANT
DURAPREP 26ML APPLICATOR (WOUND CARE) ×5 IMPLANT
GAUZE 4X4 16PLY ~~LOC~~+RFID DBL (SPONGE) ×7 IMPLANT
GAUZE PACKING IODOFORM 2 (PACKING) IMPLANT
GLOVE BIO SURGEON STRL SZ 6.5 (GLOVE) ×4 IMPLANT
GLOVE BIOGEL PI IND STRL 7.0 (GLOVE) ×6 IMPLANT
GLOVE BIOGEL PI INDICATOR 7.0 (GLOVE) ×7
GLOVE SURG LTX SZ6.5 (GLOVE) ×6 IMPLANT
GOWN STRL REUS W/ TWL LRG LVL3 (GOWN DISPOSABLE) ×4 IMPLANT
GOWN STRL REUS W/TWL LRG LVL3 (GOWN DISPOSABLE) ×6
IV NS IRRIG 3000ML ARTHROMATIC (IV SOLUTION) ×1 IMPLANT
KIT BLADEGUARD II DBL (SET/KITS/TRAYS/PACK) ×1 IMPLANT
KIT TURNOVER CYSTO (KITS) ×6 IMPLANT
LEGGING LITHOTOMY PAIR STRL (DRAPES) ×3 IMPLANT
NDL HYPO 25X1 1.5 SAFETY (NEEDLE) ×2 IMPLANT
NDL INSUFFLATION 14GA 120MM (NEEDLE) ×2 IMPLANT
NEEDLE HYPO 25X1 1.5 SAFETY (NEEDLE) ×6 IMPLANT
NEEDLE INSUFFLATION 14GA 120MM (NEEDLE) ×3 IMPLANT
NS IRRIG 1000ML POUR BTL (IV SOLUTION) ×2 IMPLANT
PACK LAVH (CUSTOM PROCEDURE TRAY) ×3 IMPLANT
PACK PERI GYN (CUSTOM PROCEDURE TRAY) ×3 IMPLANT
PACK ROBOTIC GOWN (GOWN DISPOSABLE) ×3 IMPLANT
PAD ARMBOARD 7.5X6 YLW CONV (MISCELLANEOUS) ×6 IMPLANT
POWDER SURGICEL 3.0 GRAM (HEMOSTASIS) ×3 IMPLANT
PROTECTOR NERVE ULNAR (MISCELLANEOUS) ×6 IMPLANT
SET BASIN LINEN APH (SET/KITS/TRAYS/PACK) ×3 IMPLANT
SET TUBE SMOKE EVAC HIGH FLOW (TUBING) ×3 IMPLANT
SHEARS HARMONIC ACE PLUS 36CM (ENDOMECHANICALS) ×3 IMPLANT
SLEEVE Z-THREAD 5X100MM (TROCAR) ×3 IMPLANT
SOLUTION ELECTROLUBE (MISCELLANEOUS) ×3 IMPLANT
SPIKE FLUID TRANSFER (MISCELLANEOUS) ×2 IMPLANT
SUT MON AB 4-0 PS1 27 (SUTURE) ×3 IMPLANT
SUT VIC AB 0 CT1 18XCR BRD8 (SUTURE) ×2 IMPLANT
SUT VIC AB 0 CT1 36 (SUTURE) ×6 IMPLANT
SUT VIC AB 0 CT1 8-18 (SUTURE) ×3
SUT VICRYL 0 TIES 12 18 (SUTURE) ×3 IMPLANT
SYR 10ML LL (SYRINGE) ×3 IMPLANT
SYR BULB IRRIG 60ML STRL (SYRINGE) ×3 IMPLANT
SYR CONTROL 10ML LL (SYRINGE) ×4 IMPLANT
SYSTEM CARTER THOMASON II (TROCAR) IMPLANT
TOWEL GREEN STERILE FF (TOWEL DISPOSABLE) ×6 IMPLANT
TRAY FOLEY W/BAG SLVR 16FR (SET/KITS/TRAYS/PACK) ×3
TRAY FOLEY W/BAG SLVR 16FR ST (SET/KITS/TRAYS/PACK) ×2 IMPLANT
TROCAR KII 8X100ML NONTHREADED (TROCAR) ×3 IMPLANT
TROCAR Z-THREAD FIOS 5X100MM (TROCAR) ×3 IMPLANT
UNDERPAD 30X36 HEAVY ABSORB (UNDERPADS AND DIAPERS) ×3 IMPLANT
VERSALIGHT (MISCELLANEOUS) ×1 IMPLANT
WARMER LAPAROSCOPE (MISCELLANEOUS) ×3 IMPLANT
WATER STERILE IRR 500ML POUR (IV SOLUTION) ×1 IMPLANT

## 2022-01-28 NOTE — Progress Notes (Signed)
Foley d/c'd after balloon deflated. 30 ml of clear yellow urine obtained. Tolerated well.

## 2022-01-28 NOTE — Anesthesia Procedure Notes (Addendum)
Procedure Name: Intubation Date/Time: 01/28/2022 8:13 AM  Performed by: Trixie Rude, MDPre-anesthesia Checklist: Patient identified, Emergency Drugs available, Suction available and Patient being monitored Patient Re-evaluated:Patient Re-evaluated prior to induction Oxygen Delivery Method: Circle system utilized Preoxygenation: Pre-oxygenation with 100% oxygen Induction Type: IV induction Ventilation: Mask ventilation without difficulty Laryngoscope Size: Mac and 3 Grade View: Grade I Tube type: Oral Tube size: 7.0 mm Number of attempts: 1 Airway Equipment and Method: Stylet and Oral airway Placement Confirmation: ETT inserted through vocal cords under direct vision, positive ETCO2 and breath sounds checked- equal and bilateral Secured at: 21 cm Tube secured with: Tape Dental Injury: Teeth and Oropharynx as per pre-operative assessment

## 2022-01-28 NOTE — Anesthesia Preprocedure Evaluation (Signed)
Anesthesia Evaluation  Patient identified by MRN, date of birth, ID band Patient awake    Reviewed: Allergy & Precautions, NPO status , Patient's Chart, lab work & pertinent test results  Airway Mallampati: I  TM Distance: >3 FB Neck ROM: Full    Dental no notable dental hx.    Pulmonary neg pulmonary ROS,    Pulmonary exam normal        Cardiovascular + dysrhythmias      Neuro/Psych Anxiety negative neurological ROS     GI/Hepatic Neg liver ROS, GERD  ,  Endo/Other  negative endocrine ROS  Renal/GU negative Renal ROS     Musculoskeletal negative musculoskeletal ROS (+)   Abdominal Normal abdominal exam  (+)   Peds  Hematology negative hematology ROS (+)   Anesthesia Other Findings   Reproductive/Obstetrics                             Anesthesia Physical Anesthesia Plan  ASA: 2  Anesthesia Plan: General   Post-op Pain Management:    Induction: Intravenous  PONV Risk Score and Plan: 2  Airway Management Planned: Oral ETT  Additional Equipment:   Intra-op Plan:   Post-operative Plan: Extubation in OR  Informed Consent: I have reviewed the patients History and Physical, chart, labs and discussed the procedure including the risks, benefits and alternatives for the proposed anesthesia with the patient or authorized representative who has indicated his/her understanding and acceptance.     Dental advisory given  Plan Discussed with: CRNA  Anesthesia Plan Comments:         Anesthesia Quick Evaluation

## 2022-01-28 NOTE — Transfer of Care (Signed)
Immediate Anesthesia Transfer of Care Note  Patient: Tina Osborn  Procedure(s) Performed: LAPAROSCOPIC ASSISTED VAGINAL HYSTERECTOMY (Abdomen) LAPAROSCOPIC BILATERAL SALPINGECTOMY (Bilateral: Abdomen)  Patient Location: PACU  Anesthesia Type:General  Level of Consciousness: awake, alert  and oriented  Airway & Oxygen Therapy: Patient connected to nasal cannula oxygen  Post-op Assessment: Report given to RN and Post -op Vital signs reviewed and stable  Post vital signs: Reviewed and stable  Last Vitals:  Vitals Value Taken Time  BP 111/48 01/28/22 1016  Temp    Pulse 100 01/28/22 1018  Resp 16 01/28/22 1018  SpO2 100 % 01/28/22 1018  Vitals shown include unvalidated device data.  Last Pain:  Vitals:   01/28/22 4650  TempSrc: Oral  PainSc: 0-No pain      Patients Stated Pain Goal: 7 (01/28/22 3546)  Complications: No notable events documented.

## 2022-01-28 NOTE — Op Note (Signed)
Preoperative Diagnosis: Abnormal uterine bleeding, Pelvic pain  Postoperative Diagnosis: same  Procedure: Laparoscopy Assisted Vaginal Hysterectomy, Bilateral salpingectomy Surgeon: Dr. Myna Hidalgo  Assistant: Dr. Turner Daniels Anesthetic: General  IVF: 1000cc  EBL: 150cc  UOP: 50cc  Specimens: 1) Bilateral fallopian tubes and uterus   Findings: No free fluid or omental studding appreciated. Normal appearing liver, gallbladder and bowel. Retroverted uterus, normal fallopian tubes and ovaries bilaterally.    Procedure: The patient was taken to the operating room where general anesthesia was found to be adequate. The patient's abdomen was prepped with ChloraPrep. The perineum and vagina were prepped with multiple layers of Betadine. The patient was sterilely draped. A Foley catheter was placed in the bladder. Hulka manipulator could not be passed through the internal os, a single tooth tenaculum was placed on the anterior lip of the cervix.  Gown and gloves were changed and attention was turned to the abdomen. An incision was made in the supraumbilical area and the Veress needle was inserted into the abdominal cavity without difficulty. Proper placement was confirmed using the saline drop test and opening pressure was . A pneumoperitoneum was obtained. The laparoscopic trocar and the laparoscope were placed under direct visualization. Two additional ports were placed in the right and left lower quadrants. Each area was injected with quarter percent Marcaine. A small incision was made and a 5 mm trocar was inserted into the abdominal cavity under direct visualization. An abdominal scan was performed with the findings noted above.   Attention was turned to the left adnexa.  The left round ligament was then grasped, elevated, fulgurated and divided using the Harmonic. The anterior leaf of the broad ligament was incised inferiorly and the vesicouterine flap was created.  Serial ligation of the left  fallopian tube was performed from the fimbrae up to the proximal portion.  The proximal portion of the utero-ovarian ligament was ligated with the Harmonic.  The fallopian tube was removed.   Attention was turned to the right adnexa. In a similar fashion, the right round ligament was clamped, elevated, ligated, and divided with the Harmonic. The vesicouterine fold of peritoneum was incised anteriorly for full dissection of the vesicouterine flap. The fallopian tube was identified and serial ligation was performed using the Harmonic. The right uterine artery was clamped with surgical clips and ligated with the Harmonic.  A similar procedure was carried out on the left.  The case then proceeded to the vaginal portion.  The manipulator was removed and a weighted speculum was placed in the posterior vagina. The cervix was injected with half percent Lidocaine with epinephrine. The cervix was then circumferentially incised with the bovie and the bladder was dissected off the pubovesical cervical fascia. The posterior cul-de-sac was entered sharply.  A heany clamp was placed over the uterosacral ligaments bilaterally. These were transected and suture ligated with 0 vicryl. The cardinal ligaments were then clamped bilaterally and transected and suture ligated in a similar fashion.  The uterus was then flipped to allow for palpation of the remaining broad ligament connection, which was clamped, cut and suture ligated on both the right and left side.  The uterus and  bilateral fallopian tube were removed from the operative field and sent to pathology. Hemostasis was confirmed. Modified McCall's culdoplasty was placed.  The vaginal cuff was closed in a running locked fashion using 0 vicryl. Gown and gloves were changed and attention was turned to the abdomen.  The pneumoperitoneum was reestablished. The pelvis was irrigated and inspected.  A surgical clip was placed near the right ovary.  Excellent hemostasis was noted.   The lateral 5 mm trocars were removed under direct visualization. The pneumoperitoneum was allowed to escape. The supraumbilical trocar was removed under direct visualization. Dermabond was placed over the three trocar sites. The patient tolerated her procedure well. She was awakened from her anesthetic without difficulty and then transported to the recovery room in stable condition. Sponge, needle, and instrument counts were correct.   An experienced assistant was required given the standard of surgical care given the complexity of the case.  This assistant was needed for exposure, dissection, suctioning, retraction, instrument exchange and for overall help during the procedure.   Myna Hidalgo, DO Attending Obstetrician & Gynecologist, [facility] for [employer], [facility]

## 2022-01-28 NOTE — Anesthesia Postprocedure Evaluation (Signed)
Anesthesia Post Note  Patient: Tina Osborn  Procedure(s) Performed: LAPAROSCOPIC ASSISTED VAGINAL HYSTERECTOMY (Abdomen) LAPAROSCOPIC BILATERAL SALPINGECTOMY (Bilateral: Abdomen)  Patient location during evaluation: PACU Anesthesia Type: General Level of consciousness: awake and alert Pain management: pain level controlled Vital Signs Assessment: post-procedure vital signs reviewed and stable Respiratory status: spontaneous breathing, nonlabored ventilation, respiratory function stable and patient connected to nasal cannula oxygen Cardiovascular status: blood pressure returned to baseline and stable Postop Assessment: no apparent nausea or vomiting Anesthetic complications: no   There were no known notable events for this encounter.   Last Vitals:  Vitals:   01/28/22 1145 01/28/22 1206  BP: 106/68   Pulse: 87 76  Resp: 13 16  Temp:  37 C  SpO2: 100% 97%    Last Pain:  Vitals:   01/28/22 1206  TempSrc: Oral  PainSc: 0-No pain                 Glynis Smiles

## 2022-01-28 NOTE — Discharge Instructions (Addendum)
Post Operative Pain Med Plan:  >Take gabapentin 300 mg three times per day, as prescribed for 4 days, try to space them evenly  >Take the oxycodone- 1 tablet, on a schedule, around the clock, every 6 hours(set your phone alarm) for the first 2 days, there may be times when you will need 2 tablets but taking them on a schedule will decrease this need  >You can also take Tylenol together with the oxycodone.  If the oxycodone seems "to strong" then just take Tylenol  >Oxycodone will cause constipation, please be sure to take a stool softener (Colace) twice daily while taking this pain medication and/or continue this medication until your bowel regimen returns to normal  >Take the Toradol every 8 hours for the first 3 days then the remainder to supplement the pain as needed  >After the toradol is gone switch to Ibuprofen 600mg  every 6 hours as needed  If possible try to take the Toradol or Ibuprofen with food to help avoid upsetting your stomach  >Use a heating pad as well as needed  >I have also sent a prescription for zofran (ondansetron) for nausea to take if needed over the first couple of days  >Be gentle with your diet the first few days, liquids and soft non spicy food, fruits are great  >Get up and move, no lifting or straining   HOME INSTRUCTIONS  Please note any unusual or excessive bleeding, pain, swelling. Mild dizziness or drowsiness are normal for about 24 hours after surgery.   Shower when comfortable  Restrictions: No driving for 24 hours or while taking pain medications.  Activity:  No heavy lifting (> 10 lbs), nothing in vagina (no tampons, douching, or intercourse) x 4 weeks; no tub baths for 4 weeks Vaginal spotting is expected but if your bleeding is heavy, period like,  please call the office   Incision: the bandaids will fall off when they are ready to; you may clean your incision with mild soap and water but do not rub or scrub the incision site.  You may  experience slight bloody drainage from your incision periodically.  This is normal.  If you experience a large amount of drainage or the incision opens, please call your physician who will likely direct you to the emergency department.  Diet:  You may return to your regular diet.  Do not eat large meals.  Eat small frequent meals throughout the day.  Continue to drink a good amount of water at least 6-8 glasses of water per day, hydration is very important for the healing process.  Pain Management: Read above  Always take prescription pain medication with food.  Oxycodone may cause constipation, please take a stool softener while taking this medication.  A prescription of colace has been sent in to take twice daily if needed while taking the oxycodone.  Be sure to drink plenty of fluids and increase your fiber to help with constipation.  Alcohol -- Avoid for 24 hours and while taking pain medications.  Nausea: Take sips of ginger ale or soda and zofran (ondansetron) has also been sent in if needed  Fever -- Call physician if temperature over 101 degrees  Follow up:  If you do not already have a follow up appointment scheduled, please call the office at [phone].  If you experience fever (a temperature greater than 100.4), pain unrelieved by pain medication, shortness of breath, swelling of a single leg, or any other symptoms which are concerning to you please the  office immediately.

## 2022-01-28 NOTE — Progress Notes (Signed)
Dr Charlotta Newton called. Pt status discussed. No new orders given.

## 2022-01-29 DIAGNOSIS — Z029 Encounter for administrative examinations, unspecified: Secondary | ICD-10-CM

## 2022-01-30 ENCOUNTER — Telehealth: Payer: Self-pay | Admitting: Obstetrics & Gynecology

## 2022-01-30 LAB — SURGICAL PATHOLOGY

## 2022-01-30 NOTE — Telephone Encounter (Signed)
Called patient to check in postop.   Pain is doing better today.  Actually getting up and moving.  Notes tolerating po and passing flatus.   Pt to call with any concerns, but overall sounds like she is meeting postop milestones appropriately.  Myna Hidalgo, DO Attending Obstetrician & Gynecologist, [facility] for [employer], [facility]

## 2022-02-02 ENCOUNTER — Encounter (HOSPITAL_COMMUNITY): Payer: Self-pay | Admitting: Obstetrics & Gynecology

## 2022-02-04 ENCOUNTER — Encounter: Payer: Self-pay | Admitting: Obstetrics & Gynecology

## 2022-02-04 ENCOUNTER — Ambulatory Visit (INDEPENDENT_AMBULATORY_CARE_PROVIDER_SITE_OTHER): Payer: BC Managed Care – PPO | Admitting: Obstetrics & Gynecology

## 2022-02-04 VITALS — BP 116/80 | HR 68 | Wt 163.8 lb

## 2022-02-04 DIAGNOSIS — Z09 Encounter for follow-up examination after completed treatment for conditions other than malignant neoplasm: Secondary | ICD-10-CM

## 2022-02-04 NOTE — Progress Notes (Signed)
    PostOp Visit Note  Tina Osborn is a 36 y.o. G80P2002 female who presents for a postoperative visit. She is 1 week postop following a LAVH, BS completed on 7/12   Today she notes some pain and discomfort, but overall doing ok. Still taking the oxycodone on occasion.  Denies fever or chills.  Tolerating gen diet.  +Flatus, Regular BMs.  Overall doing well and reports no acute complaints   Review of Systems Pertinent items are noted in HPI.    Objective:  BP 116/80 (BP Location: Right Arm, Patient Position: Sitting, Cuff Size: Normal)   Pulse 68   Wt 163 lb 12.8 oz (74.3 kg)   LMP 10/08/2021 Comment: negative urine test on 01/26/22  BMI 26.44 kg/m    Physical Examination:  GENERAL ASSESSMENT: well developed and well nourished SKIN: normal color, no lesions CHEST: normal air exchange, respiratory effort normal with no retractions HEART: regular rate and rhythm ABDOMEN: soft, non-distended, +BS INCISION: C/D/I with dermabond EXTREMITY: no calf tenderness, no edema PSYCH: mood appropriate, normal affect       Assessment:    Postop check   Plan:   -incisions healing appropriately -reviewed care of incisions and activity -meeting milestones appropriately -f/u as scheduled in 4-5wks  Myna Hidalgo, DO Attending Obstetrician & Gynecologist, [facility] for [employer], [facility]

## 2022-02-09 ENCOUNTER — Encounter: Payer: Self-pay | Admitting: Obstetrics & Gynecology

## 2022-02-20 ENCOUNTER — Encounter: Payer: Self-pay | Admitting: Obstetrics & Gynecology

## 2022-03-08 ENCOUNTER — Other Ambulatory Visit: Payer: Self-pay

## 2022-03-08 ENCOUNTER — Encounter: Payer: Self-pay | Admitting: Emergency Medicine

## 2022-03-08 ENCOUNTER — Emergency Department (HOSPITAL_COMMUNITY)
Admission: EM | Admit: 2022-03-08 | Discharge: 2022-03-08 | Disposition: A | Discharge status: Home / Self Care | Payer: BC Managed Care – PPO | Attending: Emergency Medicine | Admitting: Emergency Medicine

## 2022-03-08 ENCOUNTER — Emergency Department (HOSPITAL_COMMUNITY): Payer: BC Managed Care – PPO

## 2022-03-08 ENCOUNTER — Ambulatory Visit
Admission: EM | Admit: 2022-03-08 | Discharge: 2022-03-08 | Disposition: A | Discharge status: Home / Self Care | Payer: BC Managed Care – PPO | Attending: Nurse Practitioner | Admitting: Nurse Practitioner

## 2022-03-08 ENCOUNTER — Encounter (HOSPITAL_COMMUNITY): Payer: Self-pay | Admitting: *Deleted

## 2022-03-08 DIAGNOSIS — R0602 Shortness of breath: Secondary | ICD-10-CM

## 2022-03-08 DIAGNOSIS — R202 Paresthesia of skin: Secondary | ICD-10-CM

## 2022-03-08 DIAGNOSIS — R531 Weakness: Secondary | ICD-10-CM | POA: Insufficient documentation

## 2022-03-08 LAB — CBC WITH DIFFERENTIAL/PLATELET
Abs Immature Granulocytes: 0.01 10*3/uL (ref 0.00–0.07)
Basophils Absolute: 0 10*3/uL (ref 0.0–0.1)
Basophils Relative: 1 %
Eosinophils Absolute: 0.2 10*3/uL (ref 0.0–0.5)
Eosinophils Relative: 4 %
HCT: 38.3 % (ref 36.0–46.0)
Hemoglobin: 12.8 g/dL (ref 12.0–15.0)
Immature Granulocytes: 0 %
Lymphocytes Relative: 34 %
Lymphs Abs: 2 10*3/uL (ref 0.7–4.0)
MCH: 30.3 pg (ref 26.0–34.0)
MCHC: 33.4 g/dL (ref 30.0–36.0)
MCV: 90.8 fL (ref 80.0–100.0)
Monocytes Absolute: 0.6 10*3/uL (ref 0.1–1.0)
Monocytes Relative: 10 %
Neutro Abs: 2.9 10*3/uL (ref 1.7–7.7)
Neutrophils Relative %: 51 %
Platelets: 159 10*3/uL (ref 150–400)
RBC: 4.22 MIL/uL (ref 3.87–5.11)
RDW: 13.2 % (ref 11.5–15.5)
WBC: 5.8 10*3/uL (ref 4.0–10.5)
nRBC: 0 % (ref 0.0–0.2)

## 2022-03-08 LAB — COMPREHENSIVE METABOLIC PANEL
ALT: 12 U/L (ref 0–44)
AST: 16 U/L (ref 15–41)
Albumin: 4.2 g/dL (ref 3.5–5.0)
Alkaline Phosphatase: 45 U/L (ref 38–126)
Anion gap: 7 (ref 5–15)
BUN: 18 mg/dL (ref 6–20)
CO2: 23 mmol/L (ref 22–32)
Calcium: 9.5 mg/dL (ref 8.9–10.3)
Chloride: 108 mmol/L (ref 98–111)
Creatinine, Ser: 0.77 mg/dL (ref 0.44–1.00)
GFR, Estimated: >60 mL/min (ref >60)
Glucose, Bld: 85 mg/dL (ref 70–99)
Potassium: 4.3 mmol/L (ref 3.5–5.1)
Sodium: 138 mmol/L (ref 135–145)
Total Bilirubin: 0.4 mg/dL (ref 0.3–1.2)
Total Protein: 7.4 g/dL (ref 6.5–8.1)

## 2022-03-08 LAB — D-DIMER, QUANTITATIVE: D-Dimer, Quant: 1.59 ug/mL-FEU — ABNORMAL HIGH (ref 0.00–0.50)

## 2022-03-08 MED ORDER — IOHEXOL 350 MG/ML SOLN
75.0000 mL | Freq: Once | INTRAVENOUS | Status: AC | PRN
  Administered 2022-03-08: 75 mL via INTRAVENOUS

## 2022-03-08 NOTE — ED Triage Notes (Signed)
Pt with SOB at rest and worse with exertion that started today. Sent here from UC.  Weakness to left arm.  Denies any weakness or tingling elsewhere. + fatigue Denies chest pain.  + upper back pain.

## 2022-03-08 NOTE — ED Triage Notes (Signed)
Pain in between shoulder blades x 2 days.  Feel SOB and weakness in left arm

## 2022-03-08 NOTE — ED Notes (Signed)
Patient is being discharged from the Urgent Care and sent to the Emergency Department via private vehicle . Per NP, patient is in need of higher level of care due to SOB and left arm numbness with pain in shoulder blades. Patient is aware and verbalizes understanding of plan of care.  Vitals:   03/08/22 1102  BP: 122/80  Pulse: 79  Resp: 18  Temp: 97.9 F (36.6 C)  SpO2: 99%

## 2022-03-08 NOTE — Discharge Instructions (Addendum)
As discussed, your evaluation today has been largely reassuring.  But, it is important that you monitor your condition carefully, and do not hesitate to return to the ED if you develop new, or concerning changes in your condition.  Otherwise, please follow-up with your physician for appropriate ongoing care.  With Dr. Margo Aye to discuss today's evaluation, or your recent change in beta-blocker medication, and consider referral to rheumatology given your history of episodic tachycardia or fast heart rate.

## 2022-03-08 NOTE — ED Provider Notes (Signed)
[facility] EMERGENCY DEPARTMENT Provider Note   CSN: [id number] Arrival date & time: 03/08/22  1121     History  Chief Complaint  Patient presents with   Shortness of Breath    Tina Osborn is a 36 y.o. female.  HPI Presents with dyspnea.  Presents due to dyspnea, both at rest and with exertion, onset was within the past 24 hours.  There is some weakness in the left arm, and associated generalized weakness, but no chest pain, no headache, no fever, no cough.  History is most notable for mother with history of PE and the patient herself had hysterectomy performed within the past 2 months.  She is a non-smoker, does have history of intermittent tachycardia, has been seen, evaluated by cardiology and primary care, and is currently taking a beta-blocker.    Home Medications Prior to Admission medications   Medication Sig Start Date End Date Taking? Authorizing Provider  acetaminophen (TYLENOL) 325 MG tablet Take 2 tablets (650 mg total) by mouth every 6 (six) hours as needed. 01/28/22   Myna Hidalgo, DO  escitalopram (LEXAPRO) 5 MG tablet Take 10 mg by mouth daily. 08/17/21   [provider]  gabapentin (NEURONTIN) 300 MG capsule Take 1 capsule (300 mg total) by mouth 3 (three) times daily for 4 days. 01/28/22 02/01/22  Myna Hidalgo, DO  ibuprofen (ADVIL) 600 MG tablet Take 1 tablet (600 mg total) by mouth every 6 (six) hours as needed. 01/28/22   Myna Hidalgo, DO  metoprolol succinate (TOPROL-XL) 100 MG 24 hr tablet Take 75 mg by mouth daily. 10/22/21   [provider]  ondansetron (ZOFRAN) 4 MG tablet Take 1 tablet (4 mg total) by mouth every 8 (eight) hours as needed for nausea or vomiting. Patient not taking: Reported on 02/04/2022 01/28/22   Myna Hidalgo, DO  pantoprazole (PROTONIX) 40 MG tablet Take 40 mg by mouth daily. 04/01/20   [provider]      Allergies    Hydrocortisone    Review of Systems   Review of Systems  All other systems  reviewed and are negative.   Physical Exam Updated Vital Signs BP 114/79   Pulse 75   Temp 98 F (36.7 C) (Oral)   Resp 20   Ht 5\' 6"  (1.676 m)   Wt 73 kg   LMP 10/08/2021 Comment: negative urine test on 01/26/22  SpO2 100%   BMI 25.99 kg/m  Physical Exam Vitals and nursing note reviewed.  Constitutional:      General: She is not in acute distress.    Appearance: She is well-developed.  HENT:     Head: Normocephalic and atraumatic.  Eyes:     Conjunctiva/sclera: Conjunctivae normal.  Cardiovascular:     Rate and Rhythm: Normal rate and regular rhythm.  Pulmonary:     Effort: Pulmonary effort is normal. No respiratory distress.     Breath sounds: Normal breath sounds. No stridor.  Abdominal:     General: There is no distension.  Skin:    General: Skin is warm and dry.  Neurological:     Mental Status: She is alert and oriented to person, place, and time.     Cranial Nerves: No cranial nerve deficit.  Psychiatric:        Mood and Affect: Mood normal.     ED Results / Procedures / Treatments   Labs (all labs ordered are listed, but only abnormal results are displayed) Labs Reviewed  D-DIMER, QUANTITATIVE - Abnormal; Notable for  the following components:      Result Value   D-Dimer, Quant 1.59 (*)    All other components within normal limits  COMPREHENSIVE METABOLIC PANEL  CBC WITH DIFFERENTIAL/PLATELET    EKG EKG Interpretation  Date/Time:  Sunday March 08 2022 11:49:06 EDT Ventricular Rate:  77 PR Interval:  124 QRS Duration: 74 QT Interval:  380 QTC Calculation: 430 R Axis:   89 Text Interpretation: Normal sinus rhythm with sinus arrhythmia Normal ECG Confirmed by Gerhard Munch [phone]) on 03/08/2022 3:16:03 PM  Radiology CT Angio Chest PE W/Cm &/Or Wo Cm  Result Date: 03/08/2022 CLINICAL DATA:  PE suspected, positive D-dimer, shortness of breath EXAM: CT ANGIOGRAPHY CHEST WITH CONTRAST TECHNIQUE: Multidetector CT imaging of the chest was performed  using the standard protocol during bolus administration of intravenous contrast. Multiplanar CT image reconstructions and MIPs were obtained to evaluate the vascular anatomy. RADIATION DOSE REDUCTION: This exam was performed according to the departmental dose-optimization program which includes automated exposure control, adjustment of the mA and/or kV according to patient size and/or use of iterative reconstruction technique. CONTRAST:  63mL OMNIPAQUE IOHEXOL 350 MG/ML SOLN COMPARISON:  03/29/2020 FINDINGS: Cardiovascular: Satisfactory opacification of the pulmonary arteries to the segmental level. No evidence of pulmonary embolism. Normal heart size. No pericardial effusion. Mediastinum/Nodes: No enlarged mediastinal, hilar, or axillary lymph nodes. Thyroid gland, trachea, and esophagus demonstrate no significant findings. Lungs/Pleura: Lungs are clear. No pleural effusion or pneumothorax. Upper Abdomen: No acute abnormality. Musculoskeletal: No chest wall abnormality. No acute osseous findings. Review of the MIP images confirms the above findings. IMPRESSION: 1.  Negative examination for pulmonary embolism. 2.  No acute abnormality of the lungs. Electronically Signed   By: Jearld Lesch M.D.   On: 03/08/2022 14:58   DG Chest 2 View  Result Date: 03/08/2022 CLINICAL DATA:  Short of breath.  Upper back pain.  Arm numbness. EXAM: CHEST - 2 VIEW COMPARISON:  03/28/2020. FINDINGS: Normal heart, mediastinum and hila. Clear lungs.  No pleural effusion or pneumothorax. Skeletal structures are unremarkable. IMPRESSION: Normal chest radiographs. Electronically Signed   By: Amie Portland M.D.   On: 03/08/2022 13:02    Procedures Procedures    Medications Ordered in ED Medications  iohexol (OMNIPAQUE) 350 MG/ML injection 75 mL (75 mLs Intravenous Contrast Given 03/08/22 1441)    ED Course/ Medical Decision Making/ A&P This patient with a Hx of tachycardia, recent hysterectomy presents to the ED for concern of  dyspnea, this involves an extensive number of treatment options, and is a complaint that carries with it a high risk of complications and morbidity.    The differential diagnosis includes PE, pneumonia, musculoskeletal, less likely pneumothorax, ACS   Social Determinants of Health:  No limits  Additional history obtained:  Additional history and/or information obtained from husband at bedside, notable for details of HPI   After the initial evaluation, orders, including: X-ray labs were initiated.   Patient placed on Cardiac and Pulse-Oximetry Monitors. The patient was maintained on a cardiac monitor.  The cardiac monitored showed an rhythm of 80 sinus normal The patient was also maintained on pulse oximetry. The readings were typically a percent room air normal   On repeat evaluation of the patient stayed the same We discussed all findings at length Lab Tests:  I personally interpreted labs.  The pertinent results include: Labs reassuring aside from elevated D-dimer  Imaging Studies ordered:  I independently visualized and interpreted imaging which showed no PE, no pneumonia, no pleural effusion  on CT or x-ray I agree with the radiologist interpretation   Dispostion / Final MDM:  After consideration of the diagnostic results and the patient's response to treatment, this adult female presents after recent hysterectomy now with dyspnea, with exertion and rest.  Patient is afebrile, awake, alert, no cough, no fever, low suspicion for pneumonia.  No CT or x-ray evidence for PE for pneumonia, for pneumothorax.  ECG nonischemic, and she has seen cardiology previously, no evidence for ACS.  Symptoms may be secondary to recent change in beta-blocker versus symptomatic episodic tachycardia or recovery from anesthesia.  No evidence for other acute new phenomenon.  She, her husband and I discussed all findings at length, the patient will follow primary care and possibly rheumatology given  her episodes.  Final Clinical Impression(s) / ED Diagnoses Final diagnoses:  Shortness of breath    Rx / DC Orders ED Discharge Orders     None         Gerhard Munch, MD 03/08/22 [phone]

## 2022-03-10 ENCOUNTER — Encounter: Payer: Self-pay | Admitting: Obstetrics & Gynecology

## 2022-03-10 ENCOUNTER — Ambulatory Visit (INDEPENDENT_AMBULATORY_CARE_PROVIDER_SITE_OTHER): Payer: BC Managed Care – PPO | Admitting: Obstetrics & Gynecology

## 2022-03-10 VITALS — BP 111/77 | HR 83 | Ht 66.0 in | Wt 165.4 lb

## 2022-03-10 DIAGNOSIS — Z09 Encounter for follow-up examination after completed treatment for conditions other than malignant neoplasm: Secondary | ICD-10-CM

## 2022-03-10 NOTE — Progress Notes (Signed)
    PostOp Visit Note  Tina Osborn is a 36 y.o. G34P2002 female who presents for a postoperative visit. She is 6 weeks postop following a LAVH, BS completed on 01/28/22   Today she notes that she still feels "haywire".  Feels like she has noted occasional episode of heart racing, SOB, pain in upper back and numbness in arm.  Went to ER- work up was negative. Prior work up with cardiology including Holter monitoring.  Denies fever or chills.  Tolerating gen diet.  +Flatus, Regular BMs.  Pain is well controlled.  Overall doing well and reports no acute complaints   Review of Systems Pertinent items are noted in HPI.    Objective:  BP 111/77 (BP Location: Right Arm, Patient Position: Sitting, Cuff Size: Normal)   Pulse 83   Ht 5\' 6"  (1.676 m)   Wt 165 lb 6.4 oz (75 kg)   LMP 10/08/2021 Comment: negative urine test on 01/26/22  BMI 26.70 kg/m    Physical Examination:  GENERAL ASSESSMENT: well developed and well nourished SKIN: warm and dry CHEST: normal air exchange, respiratory effort normal with no retractions HEART: regular rate and rhythm ABDOMEN: soft, non-distended, incisions well healed GU: Normal external genitalia, vaginal pink moist mucosa, vaginal cuff visualized- intact.  Suture seen- on bimanual exam cuff intact and healing appropriately EXTREMITY: no edema, no calf tenderness bilaterally PSYCH: mood appropriate, normal affect       Assessment:    Postop check Tachycardia   Plan:   -meeting postop milestones appropriately -plan to return to regular activity in 1wk -Intermittent tachycardia- seen in ER, work up negative.  Advised by ER provider to consider Rheumatology, follow up with PCP  03/29/22, DO Attending Obstetrician & Gynecologist, [facility] for [facility], [facility]

## 2022-03-12 ENCOUNTER — Other Ambulatory Visit: Payer: Self-pay | Admitting: *Deleted

## 2022-04-08 ENCOUNTER — Other Ambulatory Visit (HOSPITAL_COMMUNITY): Payer: Self-pay | Admitting: Nurse Practitioner

## 2022-04-08 ENCOUNTER — Other Ambulatory Visit: Payer: Self-pay | Admitting: Nurse Practitioner

## 2022-04-08 DIAGNOSIS — R079 Chest pain, unspecified: Secondary | ICD-10-CM

## 2022-04-08 DIAGNOSIS — R202 Paresthesia of skin: Secondary | ICD-10-CM

## 2022-04-09 ENCOUNTER — Ambulatory Visit (HOSPITAL_BASED_OUTPATIENT_CLINIC_OR_DEPARTMENT_OTHER)
Admission: RE | Admit: 2022-04-09 | Discharge: 2022-04-09 | Disposition: A | Discharge status: Home / Self Care | Payer: BC Managed Care – PPO | Source: Ambulatory Visit | Attending: Internal Medicine | Admitting: Internal Medicine

## 2022-04-09 ENCOUNTER — Ambulatory Visit (HOSPITAL_COMMUNITY)
Admission: RE | Admit: 2022-04-09 | Discharge: 2022-04-09 | Disposition: A | Discharge status: Home / Self Care | Payer: BC Managed Care – PPO | Source: Ambulatory Visit | Attending: Nurse Practitioner | Admitting: Nurse Practitioner

## 2022-04-09 DIAGNOSIS — R202 Paresthesia of skin: Secondary | ICD-10-CM | POA: Diagnosis not present

## 2022-04-09 DIAGNOSIS — R079 Chest pain, unspecified: Secondary | ICD-10-CM | POA: Diagnosis present

## 2022-04-09 NOTE — Progress Notes (Signed)
VASCULAR LAB    Left upper extremity venous duplex has been performed.  See CV proc for preliminary results.   Nicholi Ghuman, RVT 04/09/2022, 2:49 PM

## 2022-04-09 NOTE — Progress Notes (Signed)
Echocardiogram 2D Echocardiogram has been performed.  Tina Osborn M 04/09/2022, 2:24 PM

## 2022-04-12 LAB — ECHOCARDIOGRAM COMPLETE
Area-P 1/2: 3.7 cm2
Calc EF: 66.5 %
S' Lateral: 3.2 cm
Single Plane A2C EF: 63.1 %
Single Plane A4C EF: 68.5 %

## 2022-06-02 ENCOUNTER — Encounter: Payer: Self-pay | Admitting: Neurology

## 2022-07-06 ENCOUNTER — Other Ambulatory Visit (HOSPITAL_COMMUNITY): Payer: Self-pay | Admitting: Family Medicine

## 2022-07-06 ENCOUNTER — Ambulatory Visit (HOSPITAL_COMMUNITY)
Admission: RE | Admit: 2022-07-06 | Discharge: 2022-07-06 | Disposition: A | Discharge status: Home / Self Care | Payer: BC Managed Care – PPO | Source: Ambulatory Visit | Attending: Family Medicine | Admitting: Family Medicine

## 2022-07-06 DIAGNOSIS — J069 Acute upper respiratory infection, unspecified: Secondary | ICD-10-CM

## 2022-07-16 NOTE — Progress Notes (Signed)
Initial neurology clinic note  SERVICE DATE: 07/24/22  Reason for Evaluation: Consultation requested by Tina Ada, MD for an opinion regarding muscle weakness. My final recommendations will be communicated back to the requesting physician by way of shared medical record or letter to requesting physician via [place] mail.  HPI: This is [name], a 36 y.o. right-handed female with a medical history of migraines, tachycardia, anxiety, and GERD who presents to neurology clinic with the chief complaint of muscle weakness and pain. The patient is alone today.  Patient has had weakness and tachycardia for 2 years. The weakness was only on the left side (arm and leg). She saw her PCP who tried treating anxiety and reflux that helped those symptoms but not the pain, weakness, numbness, or tachycardia. There was concern it was cardiac so she saw cardiology and had multiple EKGs, Holter monitor, echocardiogram, and stress test. These were normal except the stress test had to be stopped early due to tachycardia. CT PE was negative on 03/08/22 as was CXR on 07/06/22.  Her weakness comes and goes. Sometimes it lasts 5 minutes, sometimes it lasts an hour. It occurs about every few days. She also has numbness and pain on the left. She has sharp, stabbing pain. It can take her breath away. She will have stabbing pain then achiness. It can last 5 minutes or can last days. The numbness and weakness usually come together, but the pain can be with or without the weakness. When she has pain, she can see her left pectoral muscle twitching.  Symptoms have been stable for 2 years.  Patient has migraines with aura. She may or may not always have a headache. She can lose vision in her left eye, have photophobia, phonophobia. She gets one headache per month since teenage years. Father also has migraines. She takes Sumatriptan PRN for the migraines. She may have had a headache associated with her weakness. She  has not taken it when she has a weakness episode.  Patient had lab work ordered by [facility] including ESR, CRP, and CK, but I cannot see if she has had this drawn. Per patient these were normal. She has been diagnosed with raynaud's.  The patient has not noticed any recent skin rashes nor does she report any constitutional symptoms like fever, night sweats, anorexia or unintentional weight loss.  She denies significant trauma to her head. She has scars on her head but does not know what they are. She denies developmental delay. She denies previous brain infection. She was in the [employer]  EtOH use: Rare  Restrictive diet? No Family history of neuropathy/myopathy/neurologic disease? Father has similar symptoms with left sided weakness  Lives with husband, and 2 children (71 and 37 years old) Works as Medical illustrator     MEDICATIONS:  Outpatient Encounter Medications as of 07/24/2022  Medication Sig   escitalopram (LEXAPRO) 10 MG tablet Take 10 mg by mouth daily.   metoprolol succinate (TOPROL-XL) 25 MG 24 hr tablet Take 25 mg by mouth daily.   pantoprazole (PROTONIX) 40 MG tablet Take 40 mg by mouth daily.   SUMAtriptan (IMITREX) 100 MG tablet Take 1 tablet (100 mg total) by mouth once as needed for up to 1 dose for migraine. May repeat in 2 hours if headache persists or recurs.   traZODone (DESYREL) 50 MG tablet Take 50 mg by mouth at bedtime.   [DISCONTINUED] acetaminophen (TYLENOL) 325 MG tablet Take 2 tablets (650 mg total) by mouth every  6 (six) hours as needed.   [DISCONTINUED] gabapentin (NEURONTIN) 300 MG capsule Take 1 capsule (300 mg total) by mouth 3 (three) times daily for 4 days.   [DISCONTINUED] ibuprofen (ADVIL) 600 MG tablet Take 1 tablet (600 mg total) by mouth every 6 (six) hours as needed.   [DISCONTINUED] ondansetron (ZOFRAN) 4 MG tablet Take 1 tablet (4 mg total) by mouth every 8 (eight) hours as needed for nausea or vomiting. (Patient not taking:  Reported on 02/04/2022)   No facility-administered encounter medications on file as of 07/24/2022.    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   Irregular heart rate    Medical history non-contributory     PAST SURGICAL HISTORY: Past Surgical History:  Procedure Laterality Date   LAPAROSCOPIC ASSISTED VAGINAL HYSTERECTOMY N/A 01/28/2022   Procedure: LAPAROSCOPIC ASSISTED VAGINAL HYSTERECTOMY;  Surgeon: Janyth Pupa, DO;  Location: [facility];  Service: Gynecology;  Laterality: N/A;   LAPAROSCOPIC BILATERAL SALPINGECTOMY Bilateral 01/28/2022   Procedure: LAPAROSCOPIC BILATERAL SALPINGECTOMY;  Surgeon: Janyth Pupa, DO;  Location: [facility];  Service: Gynecology;  Laterality: Bilateral;   NO PAST SURGERIES      ALLERGIES: Allergies  Allergen Reactions   Hydrocortisone Rash    cream    FAMILY HISTORY: Family History  Problem Relation Age of Onset   Cancer Paternal Grandmother        breast   Hypertension Father    Cancer Father        skin   Cancer Mother        ovarian    SOCIAL HISTORY: Social History   Tobacco Use   Smoking status: Never   Smokeless tobacco: Never  Vaping Use   Vaping Use: Never used  Substance Use Topics   Alcohol use: Yes    Comment: rare   Drug use: No   Social History   Social History Narrative   Are you right handed or left handed? Right handed   Are you currently employed ? 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 no   Who lives with you? Family    What type of home do you live in: 1 story or 2 story? 1 story          OBJECTIVE: PHYSICAL EXAM: BP 119/79   Pulse 76   Ht _0  (1.676 m)   Wt 160 lb 3.2 oz (72.7 kg)   LMP 10/08/2021 Comment: negative urine test on 01/26/22  SpO2 100%   BMI 25.86 kg/m   General: General appearance: Awake and alert. No distress. Cooperative with exam.   Skin: No obvious rash or jaundice. HEENT: Atraumatic. Anicteric. Lungs: Non-labored breathing on room air  Heart: Regular Extremities: No edema. No obvious deformity.  Musculoskeletal: No obvious joint swelling. Psych: Affect appropriate.  Neurological: Mental Status: Alert. Speech fluent. No pseudobulbar affect Cranial Nerves: CNII: No RAPD. Visual fields grossly intact. CNIII, IV, VI: PERRL. No nystagmus. EOMI. CN V: Facial sensation intact bilaterally to fine touch. CN VII: Facial muscles symmetric and strong. No ptosis at rest. CN VIII: Hearing grossly intact bilaterally. CN IX: No hypophonia. CN X: Palate elevates symmetrically. CN XI: Full strength shoulder shrug bilaterally. CN XII: Tongue protrusion full and midline. No atrophy or fasciculations. No significant dysarthria Motor: Tone is normal.  Individual muscle group testing (MRC grade out of 5):  Movement     Neck flexion 5    Neck extension 5     Right Left   Shoulder abduction 5 5   Elbow flexion 5 5   Elbow extension 5 5   Finger abduction - FDI 5 5   Finger abduction - ADM 5 5   Finger extension 5 5   Finger flexion 5 5    Hip flexion 5 5   Knee extension 5 5   Knee flexion 5 5   Dorsiflexion 5 5   Plantarflexion 5 5     Reflexes:  Right Left   Bicep 2+ 2+   Tricep 2+ 2+   BrRad 2+ 2+   Knee 2+ 2+   Ankle 2+ 2+    Pathological Reflexes: Babinski: flexor response bilaterally Hoffman: present on left, absent on right Troemner: present on left, absent on right Pectoral: positive bilaterally  Sensation: Pinprick: Intact in all extremities Proprioception: Intact in bilateral great toes Coordination: Intact finger-to- nose-finger bilaterally. Romberg negative. Gait: Able to rise from chair with arms crossed unassisted. Normal, narrow-based gait. Able to tandem walk. Able to walk on toes and heels.  Lab and Test Review: Internal labs: Normal or unremarkable: CBC, CMP (03/08/22) B12  (05/09/18): 663 TSH (04/25/18): 1.910  External labs: Lab work by Rheumatology:   Imaging: Cervical spine xray (12/02/20): FNDINGS: There is no evidence of cervical spine fracture or prevertebral soft tissue swelling. Alignment is normal. No other significant bone abnormalities are identified.   IMPRESSION: Negative cervical spine radiographs.  ASSESSMENT: Tina Osborn is a 36 y.o. female who presents for evaluation of episodic left sided weakness, numbness, and pain. She has a relevant medical history of migraines, tachycardia, anxiety, and GERD. Her neurological examination is pertinent for positive Hoffman and Tromner reflexes on left but absent on right, otherwise normal examination. Available diagnostic data is significant for normal B12, TSH, ESR, CRP, and rheumatologic screen. Interestingly, patient has a history of migraine with aura and sometimes has left vision loss without headache. She has noticed headache after some episodes of her weakness, numbness, pain. 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 Given her age, demyelinating disease is a concern, though it would be unusual to have episodic, stereotyped episodes. 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 We discussed trying sumatriptan with her next episode to see if this helps as this could indicate migraine as an etiology. I will further work up as below.  PLAN: -MRI brain and cervical spine w/wo contrast -May consider EEG -Advised patient to take sumatriptan if she has a weakness episode -May consider a migraine prevention medication  -Return to clinic 1-2 months  The impression above as well as the plan as outlined below were extensively discussed with the patient who voiced understanding. All questions were answered to their satisfaction.  When available, results of the above investigations and possible further  recommendations will be communicated to the patient via telephone/MyChart. Patient to call office if not contacted after expected testing turnaround time.   Total time spent reviewing records, interview, history/exam, documentation, and coordination of care on day of encounter:  55 min   Thank you for allowing me to participate in patient's care.  If I can answer any additional questions, I would be pleased to do so.  Kai Levins, MD   CC: Celene Squibb, MD [place] [id number]  CC: Referring provider: Carlene Coria,  Jeoffrey Massed, MD [address] [place],  [place] [id number]

## 2022-07-24 ENCOUNTER — Ambulatory Visit: Payer: BC Managed Care – PPO | Admitting: Neurology

## 2022-07-24 ENCOUNTER — Encounter: Payer: Self-pay | Admitting: Neurology

## 2022-07-24 VITALS — BP 119/79 | HR 76 | Ht 66.0 in | Wt 160.2 lb

## 2022-07-24 DIAGNOSIS — R200 Anesthesia of skin: Secondary | ICD-10-CM | POA: Diagnosis not present

## 2022-07-24 DIAGNOSIS — R531 Weakness: Secondary | ICD-10-CM | POA: Diagnosis not present

## 2022-07-24 DIAGNOSIS — G43109 Migraine with aura, not intractable, without status migrainosus: Secondary | ICD-10-CM

## 2022-07-24 DIAGNOSIS — R202 Paresthesia of skin: Secondary | ICD-10-CM | POA: Diagnosis not present

## 2022-07-24 MED ORDER — SUMATRIPTAN SUCCINATE 100 MG PO TABS: 100.0000 mg | ORAL_TABLET | Freq: Once | ORAL | 2 refills | Status: DC | PRN

## 2022-07-24 NOTE — Patient Instructions (Signed)
I saw you today for episodes of weakness and numbness and pain. I am not sure the cause, but it could be related to your migraines.  I would like to get an MRI of your brain and neck (cervical spine) to look for other causes.  I would like you to take sumatriptan next time you have an episode of weakness and numbness and see if this helps. If it does, we may start a migraine prevention medication.  I will be in touch when I have your results.  I want to see you back in clinic in 1-2 months.  Please let me know if you have any questions or concerns in the meantime.  The physicians and staff at [facility] are committed to providing excellent care. You may receive a survey requesting feedback about your experience at our office. We strive to receive "very good" responses to the survey questions. If you feel that your experience would prevent you from giving the office a "very good " response, please contact our office to try to remedy the situation. We may be reached at [phone]. Thank you for taking the time out of your busy day to complete the survey.  Kai Levins, MD [facility]

## 2022-08-13 ENCOUNTER — Encounter: Payer: Self-pay | Admitting: Neurology

## 2022-08-14 ENCOUNTER — Ambulatory Visit
Admission: RE | Admit: 2022-08-14 | Discharge: 2022-08-14 | Disposition: A | Discharge status: Home / Self Care | Payer: BC Managed Care – PPO | Source: Ambulatory Visit | Attending: Neurology | Admitting: Neurology

## 2022-08-14 DIAGNOSIS — G43109 Migraine with aura, not intractable, without status migrainosus: Secondary | ICD-10-CM

## 2022-08-14 DIAGNOSIS — R200 Anesthesia of skin: Secondary | ICD-10-CM

## 2022-08-14 DIAGNOSIS — R531 Weakness: Secondary | ICD-10-CM

## 2022-08-14 MED ORDER — GADOPICLENOL 0.5 MMOL/ML IV SOLN
7.5000 mL | Freq: Once | INTRAVENOUS | Status: AC | PRN
  Administered 2022-08-14: 7.5 mL via INTRAVENOUS

## 2022-08-17 ENCOUNTER — Telehealth: Payer: Self-pay

## 2022-08-17 ENCOUNTER — Other Ambulatory Visit: Payer: Self-pay

## 2022-08-17 ENCOUNTER — Telehealth: Payer: Self-pay | Admitting: Neurology

## 2022-08-17 DIAGNOSIS — G43109 Migraine with aura, not intractable, without status migrainosus: Secondary | ICD-10-CM

## 2022-08-17 DIAGNOSIS — R200 Anesthesia of skin: Secondary | ICD-10-CM

## 2022-08-17 DIAGNOSIS — R531 Weakness: Secondary | ICD-10-CM

## 2022-08-17 NOTE — Telephone Encounter (Signed)
Called patient to discuss the results of her MRI brain and cervical spine. The cervical spine was essentially normal. MRI brain showed a possible small 3 mm lesion in the right frontal lobe. I do not think this lesion would explain her symptoms, but could be a nidus for seizure, which I explained to the patient. I recommended we pursue EEG testing, which patient agreed with.  She has tried sumatriptan during one episode (which she has every couple of days) which she is not sure it changed her symptoms.  All questions were answered.  Plan: -1 hour EEG, may consider longer monitoring if normal.  Kai Levins, MD [facility]

## 2022-08-17 NOTE — Telephone Encounter (Signed)
Note sent to Cherly Anderson.

## 2022-08-17 NOTE — Telephone Encounter (Signed)
-----  Message from Shellia Carwin, MD sent at 08/17/2022 10:57 AM EST ----- Regarding: 1 hour EEG Chanel Mcadams,  Can we order a 1 hour EEG for this patient? She is aware and will be expecting a call about scheduling.  Thank you,  Kai Levins, MD

## 2022-09-08 NOTE — Progress Notes (Signed)
NEUROLOGY FOLLOW UP OFFICE NOTE  Elpida Mongar [id number]  Subjective:  Tina Osborn is a 37 y.o. year old right-handed female with a medical history of migraines, tachycardia, anxiety, and GERD who we last saw on 07/24/22.  To briefly review: Patient has had weakness and tachycardia for 2 years. The weakness was only on the left side (arm and leg). She saw her PCP who tried treating anxiety and reflux that helped those symptoms but not the pain, weakness, numbness, or tachycardia. There was concern it was cardiac so she saw cardiology and had multiple EKGs, Holter monitor, echocardiogram, and stress test. These were normal except the stress test had to be stopped early due to tachycardia. CT PE was negative on 03/08/22 as was CXR on 07/06/22.   Her weakness comes and goes. Sometimes it lasts 5 minutes, sometimes it lasts an hour. It occurs about every few days. She also has numbness and pain on the left. She has sharp, stabbing pain. It can take her breath away. She will have stabbing pain then achiness. It can last 5 minutes or can last days. The numbness and weakness usually come together, but the pain can be with or without the weakness. When she has pain, she can see her left pectoral muscle twitching.   Symptoms have been stable for 2 years.   Patient has migraines with aura. She may or may not always have a headache. She can lose vision in her left eye, have photophobia, phonophobia. She gets one headache per month since teenage years. Father also has migraines. She takes Sumatriptan PRN for the migraines. She may have had a headache associated with her weakness. She has not taken it when she has a weakness episode.   Patient had lab work ordered by [facility] including ESR, CRP, and CK, but I cannot see if she has had this drawn. Per patient these were normal. She has been diagnosed with raynaud's.   The patient has not noticed any recent skin rashes nor does she  report any constitutional symptoms like fever, night sweats, anorexia or unintentional weight loss.   She denies significant trauma to her head. She has scars on her head but does not know what they are. She denies developmental delay. She denies previous brain infection. She was in the [employer]   EtOH use: Rare  Restrictive diet? No Family history of neuropathy/myopathy/neurologic disease? Father has similar symptoms with left sided weakness   Lives with husband, and 2 children (1 and 30 years old) Works as Medical illustrator  Most recent Assessment and Plan (07/24/22): Interestingly, patient has a history of migraine with aura and sometimes has left vision loss without headache. She has noticed headache after some episodes of her weakness, numbness, pain. 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 Given her age, demyelinating disease is a concern, though it would be unusual to have episodic, stereotyped episodes. 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 We discussed trying sumatriptan with her next episode to see if this helps as this could indicate migraine as an etiology. I will further work up as below.   PLAN: -MRI brain and cervical spine w/wo contrast -May consider EEG -Advised patient to take sumatriptan if she has a weakness episode -May consider a migraine prevention medication  Since their last visit: MRI cervical spine showed no concerning pathology. MRI brain was significant for a nonspecific 3 mm T2 hyperintensity in her anterior  right frontal lobe subcortical white matter. These lesion would not definitively explain patient's symptoms, so decision was made to pursue EEG. She has not gotten a call about scheduling this.  Patient had an episode last night. Around 7 pm she had pain in her chest (tightness on the left pectoral muscle area). This was coming and going most of the night. She did not  having the tingling and numbness in her arm. She woke up this morning to a full migraine. She did not take Imitrex as she cannot go to work when she takes it due to sleepiness. Her headache today is similar to prior with photophobia, phonophobia, and some difficulty with finding words.  She is having about 1 headache per week. She takes sumatriptan and naps for 2 hours and can then be symptom free. There is no positional component to headaches and no vision changes.  Patient did try to take sumatriptan during an episode of numbness and tingling on the left side, but it did not clearly help.  She denies history of kidney stones. She has previously tried propranolol but had sun sensitivity.   MEDICATIONS:  Outpatient Encounter Medications as of 09/16/2022  Medication Sig   escitalopram (LEXAPRO) 10 MG tablet Take 10 mg by mouth daily.   metoprolol succinate (TOPROL-XL) 25 MG 24 hr tablet Take 25 mg by mouth daily.   pantoprazole (PROTONIX) 40 MG tablet Take 40 mg by mouth daily.   SUMAtriptan (IMITREX) 100 MG tablet Take 1 tablet (100 mg total) by mouth once as needed for up to 1 dose for migraine. May repeat in 2 hours if headache persists or recurs.   [DISCONTINUED] traZODone (DESYREL) 50 MG tablet Take 50 mg by mouth at bedtime. (Patient not taking: Reported on 09/16/2022)   No facility-administered encounter medications on file as of 09/16/2022.    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   Irregular heart rate    Medical history non-contributory     PAST SURGICAL HISTORY: Past Surgical History:  Procedure Laterality Date   LAPAROSCOPIC ASSISTED VAGINAL HYSTERECTOMY N/A 01/28/2022   Procedure: LAPAROSCOPIC ASSISTED VAGINAL HYSTERECTOMY;  Surgeon: Janyth Pupa, DO;  Location: [facility];  Service: Gynecology;  Laterality: N/A;   LAPAROSCOPIC BILATERAL SALPINGECTOMY Bilateral 01/28/2022    Procedure: LAPAROSCOPIC BILATERAL SALPINGECTOMY;  Surgeon: Janyth Pupa, DO;  Location: [facility];  Service: Gynecology;  Laterality: Bilateral;   NO PAST SURGERIES      ALLERGIES: Allergies  Allergen Reactions   Hydrocortisone Rash    cream    FAMILY HISTORY: Family History  Problem Relation Age of Onset   Cancer Mother        ovarian   Hypertension Father    Varicose Veins Father        skin   Cancer Father    Cancer Paternal Grandmother        breast    SOCIAL HISTORY: Social History   Tobacco Use   Smoking status: Never   Smokeless tobacco: Never  Vaping Use   Vaping Use: Never used  Substance Use Topics   Alcohol use: Yes    Comment: rare   Drug use: No   Social History   Social History Narrative   Are you right handed or left handed? Right handed   Are you currently employed ? 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 no   Who lives with you? Family    What type of home do you live in: 1 story or 2 story? 1 story     Caffeine 1-2 cups      Objective:  Vital Signs:  BP 120/84   Pulse 78   Ht '5\' 6"'$  (1.676 m)   Wt 160 lb (72.6 kg)   LMP 10/08/2021 Comment: negative urine test on 01/26/22  SpO2 100%   BMI 25.82 kg/m   General: No acute distress.  Patient appears well-groomed.   Head:  Normocephalic/atraumatic Eyes:  Fundi examined, disc margins not well seen today Neck: supple, no paraspinal tenderness, full range of motion Heart:  Regular rate and rhythm Lungs:  Clear to auscultation bilaterally Back: No paraspinal tenderness Neurological Exam: alert and oriented to person, place, and time.  Speech fluent and not dysarthric, language intact.  CN II-XII intact. Bulk and tone normal, muscle strength 5/5 throughout.  Sensation to light touch intact.  Deep tendon reflexes 2+ throughout, toes downgoing.  Finger to nose testing intact.  Gait normal, Romberg negative.   Labs and Imaging review: New results: MRI  brain w/wo contrast (08/14/22): FINDINGS: Brain:   No age advanced or lobar predominant parenchymal atrophy.   Nonspecific 3 mm focus of T2 FLAIR hyperintense signal abnormality within the anterior right frontal lobe subcortical white matter (series 4, image 11) (series 9, image 15).   No cortical encephalomalacia is identified.   There is no acute infarct.   No evidence of an intracranial mass.   No chronic intracranial blood products.   No extra-axial fluid collection.   No midline shift.   No pathologic intracranial enhancement identified.   Vascular: Maintained flow voids within the proximal large arterial vessels.   Skull and upper cervical spine: No focal suspicious marrow lesion.   Sinuses/Orbits: No mass or acute finding within the imaged orbits. Tiny mucous retention cyst within the right maxillary sinus.   IMPRESSION: No evidence of acute intracranial abnormality.   Nonspecific 3 mm T2 FLAIR hyperintense remote insult within the anterior right frontal lobe white matter. Otherwise unremarkable MRI appearance of the brain.  MRI cervical spine w/wo contrast (08/14/22): FINDINGS: Intermittently motion degraded examination (with up to moderate motion degradation of the acquired sequences).   Alignment: No significant spondylolisthesis.   Vertebrae: Vertebral body height is maintained. No significant marrow edema or focal suspicious osseous lesion. Hemangioma within the C4 vertebral body.   Cord: Within limitations of motion degradation, no signal abnormality is identified within the cervical spinal cord. No pathologic spinal cord enhancement.   Posterior Fossa, vertebral arteries, paraspinal tissues: Posterior fossa assessed on same-day brain MRI. Flow voids preserved within the imaged cervical vertebral arteries. No paraspinal mass or collection.   Disc levels:   Mild multilevel disc degeneration.   C2-C3: Shallow disc bulge. No significant spinal  canal or foraminal stenosis.   C3-C4: Shallow disc bulge. Mild uncovertebral hypertrophy on the right. No significant spinal canal or foraminal stenosis.   C4-C5: Shallow disc bulge. No significant spinal canal or foraminal stenosis.   C5-C6: Disc bulge with superimposed small left center disc protrusion. Mild effacement of the ventral thecal sac (without spinal cord mass effect). No significant foraminal stenosis.   C6-C7: Shallow disc bulge. No significant spinal canal or foraminal stenosis.   C7-T1: No significant disc herniation or stenosis.   IMPRESSION: Intermittently motion degraded exam.   Cervical spondylosis, as outlined. No more than mild relative spinal canal narrowing.  No significant foraminal stenosis. Mild multilevel disc degeneration.   No lesion is identified within the cervical spinal cord.  Previously reviewed results: Normal or unremarkable: CBC, CMP (03/08/22) B12 (05/09/18): 663 TSH (04/25/18): 1.910   External labs: Lab work by Rheumatology:    Imaging: Cervical spine xray (12/02/20): FNDINGS: There is no evidence of cervical spine fracture or prevertebral soft tissue swelling. Alignment is normal. No other significant bone abnormalities are identified.   IMPRESSION: Negative cervical spine radiographs.  Assessment/Plan:  This is Walker Meyerson, a 37 y.o. female with migraines and episodic left sided numbness/tingling and weakness with chest pain. Her MRI brain and cervical spine were unremarkable. EEG is pending to rule out seizure, but the most likely etiology is migraine with aura. She is currently having about 4-5 headaches per month that respond well to Sumatriptan. I will add a preventative today. She could not tolerate propranolol previously and is already on an SSRI (Lexapro). I will start with Topamax for this reason.  Plan: -EEG - will make sure this gets scheduled today -Migraine plan: Migraine prevention:  Topamax 25 mg daily for  2 weeks, 50 mg daily thereafter Migraine rescue:  Sumatriptan 100 mg PRN Limit use of pain relievers to no more than 2 days out of week to prevent risk of rebound or medication-overuse headache. Keep headache diary Follow up 3 months   Total time spent reviewing records, interview, history/exam, documentation, and coordination of care on day of encounter:  30 min  Kai Levins, MD

## 2022-09-16 ENCOUNTER — Ambulatory Visit: Payer: BC Managed Care – PPO | Admitting: Neurology

## 2022-09-16 ENCOUNTER — Encounter: Payer: Self-pay | Admitting: Neurology

## 2022-09-16 VITALS — BP 120/84 | HR 78 | Ht 66.0 in | Wt 160.0 lb

## 2022-09-16 DIAGNOSIS — G43109 Migraine with aura, not intractable, without status migrainosus: Secondary | ICD-10-CM | POA: Diagnosis not present

## 2022-09-16 DIAGNOSIS — R200 Anesthesia of skin: Secondary | ICD-10-CM | POA: Diagnosis not present

## 2022-09-16 DIAGNOSIS — R202 Paresthesia of skin: Secondary | ICD-10-CM

## 2022-09-16 DIAGNOSIS — R531 Weakness: Secondary | ICD-10-CM | POA: Diagnosis not present

## 2022-09-16 DIAGNOSIS — R29818 Other symptoms and signs involving the nervous system: Secondary | ICD-10-CM

## 2022-09-16 MED ORDER — TOPIRAMATE 25 MG PO TABS: 50.0000 mg | ORAL_TABLET | Freq: Every day | ORAL | 5 refills | Status: DC

## 2022-09-16 NOTE — Patient Instructions (Addendum)
Migraine plan: Migraine prevention:  Topamax 25 mg daily for 2 weeks, 50 mg daily thereafter Migraine rescue:  Sumatriptan 100 mg as needed at onset of headache Limit use of pain relievers to no more than 2 days out of week to prevent risk of rebound or medication-overuse headache. Keep headache diary Follow up 3 months  We will check on status of EEG prior to leaving today.  I will be in touch when I have the results.  Please let me know if you have problems with medications or if the headaches are getting worse, changing, or not improving.  The physicians and staff at [facility] are committed to providing excellent care. You may receive a survey requesting feedback about your experience at our office. We strive to receive "very good" responses to the survey questions. If you feel that your experience would prevent you from giving the office a "very good " response, please contact our office to try to remedy the situation. We may be reached at [phone]. Thank you for taking the time out of your busy day to complete the survey.  Kai Levins, MD [facility]

## 2022-09-17 ENCOUNTER — Ambulatory Visit (INDEPENDENT_AMBULATORY_CARE_PROVIDER_SITE_OTHER): Payer: BC Managed Care – PPO | Admitting: Neurology

## 2022-09-17 DIAGNOSIS — R202 Paresthesia of skin: Secondary | ICD-10-CM

## 2022-09-17 DIAGNOSIS — R200 Anesthesia of skin: Secondary | ICD-10-CM | POA: Diagnosis not present

## 2022-09-17 DIAGNOSIS — R531 Weakness: Secondary | ICD-10-CM | POA: Diagnosis not present

## 2022-09-17 NOTE — Progress Notes (Signed)
EEG complete - results pending 

## 2022-09-22 ENCOUNTER — Encounter: Payer: Self-pay | Admitting: Neurology

## 2022-09-22 NOTE — Procedures (Signed)
ELECTROENCEPHALOGRAM REPORT  Date of Study: 09/17/2022  Patient's Name: Tina Osborn MRN: [id number] Date of Birth: [birth year]  Referring Provider: Dr. Kai Levins  Clinical History: This is a 37 year old woman with episodic left sided numbness/tingling and weakness. EEG for classification.  Medications: Topamax, Lexapro, Metoprolol, Sumatriptan  Technical Summary: A multichannel digital 1-hour EEG recording measured by the international 10-20 system with electrodes applied with paste and impedances below 5000 ohms performed in our laboratory with EKG monitoring in an awake and asleep patient.  Hyperventilation and photic stimulation were performed.  The digital EEG was referentially recorded, reformatted, and digitally filtered in a variety of bipolar and referential montages for optimal display.    Description: The patient is awake and asleep during the recording.  During maximal wakefulness, there is a symmetric, medium voltage 10 Hz posterior dominant rhythm that attenuates with eye opening.  The record is symmetric.  During drowsiness and sleep, there is an increase in theta slowing of the background.  Vertex waves and symmetric sleep spindles were seen. Hyperventilation and photic stimulation did not elicit any abnormalities.  There were no epileptiform discharges or electrographic seizures seen.    EKG lead was unremarkable.  Impression: This 1-hour awake and asleep EEG is normal.    Clinical Correlation: A normal EEG does not exclude a clinical diagnosis of epilepsy.  If further clinical questions remain, prolonged EEG may be helpful.  Clinical correlation is advised.   Ellouise Newer, M.D.

## 2022-10-26 ENCOUNTER — Ambulatory Visit
Admission: EM | Admit: 2022-10-26 | Discharge: 2022-10-26 | Disposition: A | Discharge status: Home / Self Care | Payer: BC Managed Care – PPO | Attending: Internal Medicine | Admitting: Internal Medicine

## 2022-10-26 DIAGNOSIS — R519 Headache, unspecified: Secondary | ICD-10-CM | POA: Insufficient documentation

## 2022-10-26 DIAGNOSIS — N898 Other specified noninflammatory disorders of vagina: Secondary | ICD-10-CM | POA: Diagnosis present

## 2022-10-26 DIAGNOSIS — R768 Other specified abnormal immunological findings in serum: Secondary | ICD-10-CM | POA: Diagnosis present

## 2022-10-26 MED ORDER — FLUTICASONE PROPIONATE 50 MCG/ACT NA SUSP: 2.0000 | Freq: Every day | NASAL | 0 refills | Status: DC

## 2022-10-26 MED ORDER — FLUCONAZOLE 150 MG PO TABS: 150.0000 mg | ORAL_TABLET | Freq: Every day | ORAL | 0 refills | Status: DC

## 2022-10-26 NOTE — Discharge Instructions (Addendum)
Do saline nose flushes twice a day for 5-7 days then as needed after that  If you get worse with fever after trying current treatment for your sinuses for 2 weeks, please make sure to be seen again or try a virtual visit.  We will call you with the vaginal swab results

## 2022-10-26 NOTE — ED Provider Notes (Signed)
[facility]    CSN: [id number] Arrival date & time: 10/26/22  7416      History   Chief Complaint Chief Complaint  Patient presents with   Facial Pain   Vaginitis    HPI Tina Osborn is a 37 y.o. female presents with onset of face sinus pressure and pain, HA and cough x 4 days. Denies having a fever. Started with sneezing and nose congestion. Denies being ill prior to current symptoms. Denies hx of sinus surgeries.  Has been taking Zyrtec, but is not helping  2- Has white discharge x 2 days. Admits vaginal itching and odor. Denies sex with anyone else but her husband.     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   Irregular heart rate    Medical history non-contributory     Patient Active Problem List   Diagnosis Date Noted   Ineffective breast feeding 04/25/2013   HSV-2 seropositive     Past Surgical History:  Procedure Laterality Date   LAPAROSCOPIC ASSISTED VAGINAL HYSTERECTOMY N/A 01/28/2022   Procedure: LAPAROSCOPIC ASSISTED VAGINAL HYSTERECTOMY;  Surgeon: Myna Hidalgo, DO;  Location: [facility];  Service: Gynecology;  Laterality: N/A;   LAPAROSCOPIC BILATERAL SALPINGECTOMY Bilateral 01/28/2022   Procedure: LAPAROSCOPIC BILATERAL SALPINGECTOMY;  Surgeon: Myna Hidalgo, DO;  Location: [facility];  Service: Gynecology;  Laterality: Bilateral;   NO PAST SURGERIES      OB History     Gravida  2   Para  2   Term  2   Preterm      AB      Living  2      SAB      IAB      Ectopic      Multiple      Live Births  2            Home Medications    Prior to Admission medications   Medication Sig Start Date End Date Taking? Authorizing Provider  escitalopram (LEXAPRO) 10 MG tablet Take 10 mg by mouth daily. 08/17/21  Yes [provider]  fluconazole (DIFLUCAN) 150 MG tablet Take 1 tablet (150 mg total) by mouth daily. 10/26/22  Yes  Rodriguez-Southworth, Nettie Elm, PA-C  fluticasone (FLONASE) 50 MCG/ACT nasal spray Place 2 sprays into both nostrils daily. For nose congestion, allergies and sinus inflammation. Then repeat prn 10/26/22  Yes Rodriguez-Southworth, Nettie Elm, PA-C  metoprolol succinate (TOPROL-XL) 25 MG 24 hr tablet Take 25 mg by mouth daily. 10/22/21  Yes [provider]  pantoprazole (PROTONIX) 40 MG tablet Take 40 mg by mouth daily. 04/01/20  Yes [provider]  SUMAtriptan (IMITREX) 100 MG tablet Take 1 tablet (100 mg total) by mouth once as needed for up to 1 dose for migraine. May repeat in 2 hours if headache persists or recurs. 07/24/22  Yes Antony Madura, MD  topiramate (TOPAMAX) 25 MG tablet Take 2 tablets (50 mg total) by mouth daily. Take 1 tablet (25 mg) daily for 2 weeks, then if no side effects, increase to 2 tablets (50 mg) daily thereafter. 09/16/22  Yes Antony Madura, MD    Family History Family History  Problem Relation Age of Onset   Cancer Mother        ovarian   Hypertension Father    Varicose Veins Father        skin   Cancer Father    Cancer  Paternal Grandmother        breast    Social History Social History   Tobacco Use   Smoking status: Never   Smokeless tobacco: Never  Vaping Use   Vaping Use: Never used  Substance Use Topics   Alcohol use: Yes    Comment: rare   Drug use: No     Allergies   Hydrocortisone   Review of Systems Review of Systems As noted in HPI  Physical Exam Triage Vital Signs ED Triage Vitals  Enc Vitals Group     BP 10/26/22 0909 109/76     Pulse Rate 10/26/22 0909 (!) 107     Resp 10/26/22 0909 18     Temp 10/26/22 0909 97.9 F (36.6 C)     Temp Source 10/26/22 0909 Oral     SpO2 10/26/22 0909 99 %     Weight --      Height --      Head Circumference --      Peak Flow --      Pain Score 10/26/22 0910 5     Pain Loc --      Pain Edu? --      Excl. in GC? --    No data found.  Updated Vital Signs BP 109/76 (BP  Location: Right Arm)   Pulse (!) 107   Temp 97.9 F (36.6 C) (Oral)   Resp 18   LMP 10/08/2021 Comment: negative urine test on 01/26/22  SpO2 99%   Visual Acuity Right Eye Distance:   Left Eye Distance:   Bilateral Distance:    Right Eye Near:   Left Eye Near:    Bilateral Near:     Physical Exam Physical Exam Vitals signs and nursing note reviewed.  Constitutional:      General: She is not in acute distress.    Appearance: Normal appearance. She is not ill-appearing, toxic-appearing or diaphoretic.  HENT:     Head: Normocephalic.     Right Ear: Tympanic membrane, ear canal and external ear normal.     Left Ear: Tympanic membrane, ear canal and external ear normal.     Nose: has mild swelling of mucosa, L>R with clear mucous. Maxillary and frontal sinuses are tender bilaterally, and has good transillumination of all of them.     Mouth/Throat: clear    Mouth: Mucous membranes are moist.  Eyes:     General: No scleral icterus.       Right eye: No discharge.        Left eye: No discharge.     Conjunctiva/sclera: Conjunctivae normal.  Neck:     Musculoskeletal: Neck supple. No neck rigidity.  Cardiovascular:     Rate and Rhythm: Normal rate and regular rhythm.     Heart sounds: No murmur.  Pulmonary:     Effort: Pulmonary effort is normal.     Breath sounds: Normal breath sounds.  Musculoskeletal: Normal range of motion.  Lymphadenopathy:     Cervical: No cervical adenopathy.  Skin:    General: Skin is warm and dry.     Coloration: Skin is not jaundiced.     Findings: No rash.  Neurological:     Mental Status: She is alert and oriented to person, place, and time.     Gait: Gait normal.  Psychiatric:        Mood and Affect: Mood normal.        Behavior: Behavior normal.  Thought Content: Thought content normal.        Judgment: Judgment normal.    UC Treatments / Results  Labs (all labs ordered are listed, but only abnormal results are displayed) Labs  Reviewed  CERVICOVAGINAL ANCILLARY ONLY    EKG   Radiology No results found.  Procedures Procedures (including critical care time)  Medications Ordered in UC Medications - No data to display  Initial Impression / Assessment and Plan / UC Course  I have reviewed the triage vital signs and the nursing notes.  Sinus HA Vaginal discharge  I placed her on Flonase and Diflucan as noted. She was advised to do Netie Pot saline rinses. Explained her pain is from sinus inflammation and not acute bacterial sinusitis.    Final Clinical Impressions(s) / UC Diagnoses   Final diagnoses:  Vaginal itching  HSV-2 seropositive  Sinus headache  Vaginal discharge     Discharge Instructions      Do saline nose flushes twice a day for 5-7 days then as needed after that  If you get worse with fever after trying current treatment for your sinuses for 2 weeks, please make sure to be seen again or try a virtual visit.  We will call you with the vaginal swab results      ED Prescriptions     Medication Sig Dispense Auth. Provider   fluticasone (FLONASE) 50 MCG/ACT nasal spray Place 2 sprays into both nostrils daily. For nose congestion, allergies and sinus inflammation. Then repeat prn 18.2 g Rodriguez-Southworth, Nettie Elm, PA-C   fluconazole (DIFLUCAN) 150 MG tablet Take 1 tablet (150 mg total) by mouth daily. 1 tablet Rodriguez-Southworth, Nettie Elm, PA-C      PDMP not reviewed this encounter.   Garey Ham, PA-C 10/26/22 1347

## 2022-10-26 NOTE — ED Triage Notes (Signed)
Sinus pain and pressure in face, headache, cough that  started 4 days ago. Also having white discharge, with burning possible yeast infection. Taking zyrtec with no relief of symptoms.

## 2022-10-27 LAB — CERVICOVAGINAL ANCILLARY ONLY
Bacterial Vaginitis (gardnerella): NEGATIVE
Candida Glabrata: NEGATIVE
Candida Vaginitis: NEGATIVE
Comment: NEGATIVE
Comment: NEGATIVE
Comment: NEGATIVE

## 2022-11-21 ENCOUNTER — Telehealth: Payer: BC Managed Care – PPO | Admitting: Nurse Practitioner

## 2022-11-21 ENCOUNTER — Ambulatory Visit
Admission: EM | Admit: 2022-11-21 | Discharge: 2022-11-21 | Disposition: A | Discharge status: Home / Self Care | Payer: BC Managed Care – PPO | Attending: Family Medicine | Admitting: Family Medicine

## 2022-11-21 DIAGNOSIS — M545 Low back pain, unspecified: Secondary | ICD-10-CM

## 2022-11-21 DIAGNOSIS — N3090 Cystitis, unspecified without hematuria: Secondary | ICD-10-CM | POA: Diagnosis not present

## 2022-11-21 DIAGNOSIS — R399 Unspecified symptoms and signs involving the genitourinary system: Secondary | ICD-10-CM

## 2022-11-21 LAB — POCT URINALYSIS DIP (MANUAL ENTRY)
Bilirubin, UA: NEGATIVE
Glucose, UA: 100 mg/dL — AB
Ketones, POC UA: NEGATIVE mg/dL
Nitrite, UA: POSITIVE — AB
Protein Ur, POC: 30 mg/dL — AB
Spec Grav, UA: 1.02 (ref 1.010–1.025)
Urobilinogen, UA: 1 E.U./dL
pH, UA: 7 (ref 5.0–8.0)

## 2022-11-21 MED ORDER — CEPHALEXIN 500 MG PO CAPS: 500.0000 mg | ORAL_CAPSULE | Freq: Two times a day (BID) | ORAL | 0 refills | Status: DC

## 2022-11-21 NOTE — Progress Notes (Signed)
Based on what you shared with me it looks like you have uti symptoms with back pain,that should be evaluated in a face to face office visit. Due to the associating back pain you will need a urinalysis and urine culture for proper treatment. NOTE: There will be NO CHARGE for this eVisit   If you are having a true medical emergency please call 911.      For an urgent face to face visit, [place] has six urgent care centers for your convenience:     [facility] [place] Get Driving Directions [phone] [address] [place], [place] [address]    [facility] ([place]) Get Driving Directions [phone] [address] [place], [place] [address]  [facility] ([place]) Get Driving Directions [phone] [address] [place],  [place]  [address]  [facility] Get Driving Directions [phone] [address], [address] [place], [place] [address]    [facility] at [facility] [place] Get Driving Directions  [phone] [address] [place], [place] [address]    [facility] at [place] Get Driving Directions [phone] [address] [place],  [address]  Your MyChart E-visit questionnaire answers were reviewed by a board certified advanced clinical practitioner to complete your personal care plan based on your specific symptoms.  Thank you for using e-Visits.         

## 2022-11-21 NOTE — Discharge Instructions (Signed)
You have had a urine culture sent today. We will call you with any significant abnormalities or if there is need to begin or change treatment or pursue further follow up.  You may also review your test results online through MyChart. If you do not have a MyChart account, instructions to sign up should be on your discharge paperwork.  

## 2022-11-21 NOTE — ED Triage Notes (Signed)
Pt reports lower back pain, lower abdominal pain on right side, frequent urination, not completing her stream, and burning when urinating x 2 days.   Pt took azo this morning.

## 2022-11-22 ENCOUNTER — Ambulatory Visit
Admission: EM | Admit: 2022-11-22 | Discharge: 2022-11-22 | Disposition: A | Discharge status: Home / Self Care | Payer: BC Managed Care – PPO | Attending: Physician Assistant | Admitting: Physician Assistant

## 2022-11-22 ENCOUNTER — Telehealth: Payer: Self-pay

## 2022-11-22 DIAGNOSIS — N3001 Acute cystitis with hematuria: Secondary | ICD-10-CM | POA: Insufficient documentation

## 2022-11-22 NOTE — Telephone Encounter (Signed)
Lab called and said that this patients urine culture had the wrong label on it and the paitents sample needs to be repeated. Pt was notified and said she will be back up here today to give another sample for her urine culture.

## 2022-11-23 NOTE — ED Provider Notes (Signed)
[facility]    ASSESSMENT & PLAN:  1. Cystitis    Begin: Meds ordered this encounter  Medications   cephALEXin (KEFLEX) 500 MG capsule    Sig: Take 1 capsule (500 mg total) by mouth 2 (two) times daily.    Dispense:  10 capsule    Refill:  0   No signs of pyelonephritis.  Urine culture sent. Will notify patient of any significant results. Will follow up with her PCP or here if not showing improvement over the next 48 hours, sooner if needed.  Outlined signs and symptoms indicating need for more acute intervention. Patient verbalized understanding. After Visit Summary given.  SUBJECTIVE:  Tina Osborn is a 37 y.o. female who complains of urinary frequency, urgency and dysuria for the past 2 days. Without mild R flank pain. Denies fever, chills, vaginal discharge or bleeding. Gross hematuria: not present. No specific aggravating or alleviating factors reported. No LE edema. Normal PO intake without n/v/d. Without specific abdominal pain. Ambulatory without difficulty. OTC treatment: none.  LMP: Patient's last menstrual period was 10/08/2021.  OBJECTIVE:  Vitals:   11/21/22 1112  BP: 102/72  Pulse: 77  Resp: 20  Temp: 98.2 F (36.8 C)  TempSrc: Oral  SpO2: 99%   General appearance: alert; no distress HENT: oropharynx: moist Lungs: unlabored respirations Abdomen: soft Back: no CVA tenderness Extremities: no edema; symmetrical with no gross deformities Skin: warm and dry Neurologic: normal gait Psychological: alert and cooperative; normal mood and affect  Labs Reviewed  POCT URINALYSIS DIP (MANUAL ENTRY) - Abnormal; Notable for the following components:      Result Value   Color, UA orange (*)    Clarity, UA cloudy (*)    Glucose, UA =100 (*)    Blood, UA large (*)    Protein Ur, POC =30 (*)    Nitrite, UA Positive (*)    Leukocytes, UA Large (3+) (*)    All other components within normal limits  POCT URINE PREGNANCY    Allergies   Allergen Reactions   Hydrocortisone Rash    cream    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   Irregular heart rate    Medical history non-contributory    Social History   Socioeconomic History   Marital status: Married    Spouse name: Not on file   Number of children: Not on file   Years of education: Not on file   Highest education level: Not on file  Occupational History   Not on file  Tobacco Use   Smoking status: Never   Smokeless tobacco: Never  Vaping Use   Vaping Use: Never used  Substance and Sexual Activity   Alcohol use: Yes    Comment: rare   Drug use: No   Sexual activity: Yes    Birth control/protection: Patch  Other Topics Concern   Not on file  Social History Narrative   Are you right handed or left handed? Right handed   Are you currently employed ? 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 no   Who lives with you? Family    What type of home do you live in: 1 story or 2 story? 1 story     Caffeine 1-2 cups   Social Determinants of Health   Financial Resource Strain: Low Risk  (09/12/2021)  Overall Financial Resource Strain (CARDIA)    Difficulty of Paying Living Expenses: Not hard at all  Food Insecurity: No Food Insecurity (09/12/2021)   Hunger Vital Sign    Worried About Running Out of Food in the Last Year: Never true    Ran Out of Food in the Last Year: Never true  Transportation Needs: No Transportation Needs (09/12/2021)   PRAPARE - Administrator, Civil Service (Medical): No    Lack of Transportation (Non-Medical): No  Physical Activity: Insufficiently Active (09/12/2021)   Exercise Vital Sign    Days of Exercise per Week: 4 days    Minutes of Exercise per Session: 30 min  Stress: No Stress Concern Present (09/12/2021)   [employer] of Occupational Health - Occupational Stress Questionnaire     Feeling of Stress : Only a little  Social Connections: Moderately Integrated (09/12/2021)   Social Connection and Isolation Panel [NHANES]    Frequency of Communication with Friends and Family: More than three times a week    Frequency of Social Gatherings with Friends and Family: Three times a week    Attends Religious Services: Never    Active Member of Clubs or Organizations: No    Attends Banker Meetings: More than 4 times per year    Marital Status: Married  Catering manager Violence: Not At Risk (09/12/2021)   Humiliation, Afraid, Rape, and Kick questionnaire    Fear of Current or Ex-Partner: No    Emotionally Abused: No    Physically Abused: No    Sexually Abused: No   Family History  Problem Relation Age of Onset   Cancer Mother        ovarian   Hypertension Father    Varicose Veins Father        skin   Cancer Father    Cancer Paternal Grandmother        breast        Mardella Layman, MD 11/23/22 [phone]

## 2022-11-24 LAB — URINE CULTURE: Culture: NO GROWTH

## 2022-12-11 NOTE — Progress Notes (Signed)
NEUROLOGY FOLLOW UP OFFICE NOTE  [facility] [id number]  Subjective:  Tina Osborn is a 37 y.o. year old right-handed female with a medical history of migraines, tachycardia, anxiety, and GERD who we last saw on 09/16/22.  To briefly review: Patient has had weakness and tachycardia for 2 years. The weakness was only on the left side (arm and leg). She saw her PCP who tried treating anxiety and reflux that helped those symptoms but not the pain, weakness, numbness, or tachycardia. There was concern it was cardiac so she saw cardiology and had multiple EKGs, Holter monitor, echocardiogram, and stress test. These were normal except the stress test had to be stopped early due to tachycardia. CT PE was negative on 03/08/22 as was CXR on 07/06/22.   Her weakness comes and goes. Sometimes it lasts 5 minutes, sometimes it lasts an hour. It occurs about every few days. She also has numbness and pain on the left. She has sharp, stabbing pain. It can take her breath away. She will have stabbing pain then achiness. It can last 5 minutes or can last days. The numbness and weakness usually come together, but the pain can be with or without the weakness. When she has pain, she can see her left pectoral muscle twitching.   Symptoms have been stable for 2 years.   Patient has migraines with aura. She may or may not always have a headache. She can lose vision in her left eye, have photophobia, phonophobia. She gets one headache per month since teenage years. Father also has migraines. She takes Sumatriptan PRN for the migraines. She may have had a headache associated with her weakness. She has not taken it when she has a weakness episode.   Patient had lab work ordered by [facility] including ESR, CRP, and CK, but I cannot see if she has had this drawn. Per patient these were normal. She has been diagnosed with raynaud's.   The patient has not noticed any recent skin rashes nor does she  report any constitutional symptoms like fever, night sweats, anorexia or unintentional weight loss.   She denies significant trauma to her head. She has scars on her head but does not know what they are. She denies developmental delay. She denies previous brain infection. She was in the [employer].   EtOH use: Rare  Restrictive diet? No Family history of neuropathy/myopathy/neurologic disease? Father has similar symptoms with left sided weakness   Lives with husband, and 2 children (64 and 71 years old) Works as Advertising account planner  09/16/22: MRI cervical spine showed no concerning pathology. MRI brain was significant for a nonspecific 3 mm T2 hyperintensity in her anterior right frontal lobe subcortical white matter. These lesion would not definitively explain patient's symptoms, so decision was made to pursue EEG. She has not gotten a call about scheduling this.   Patient had an episode last night. Around 7 pm she had pain in her chest (tightness on the left pectoral muscle area). This was coming and going most of the night. She did not having the tingling and numbness in her arm. She woke up this morning to a full migraine. She did not take Imitrex as she cannot go to work when she takes it due to sleepiness. Her headache today is similar to prior with photophobia, phonophobia, and some difficulty with finding words.   She is having about 1 headache per week. She takes sumatriptan and naps for 2 hours and can then be symptom free. There  is no positional component to headaches and no vision changes.   Patient did try to take sumatriptan during an episode of numbness and tingling on the left side, but it did not clearly help.   She denies history of kidney stones. She has previously tried propranolol but had sun sensitivity.   Most recent Assessment and Plan (09/16/22): This is Tina Osborn, a 37 y.o. female with migraines and episodic left sided numbness/tingling and weakness with chest pain.  Her MRI brain and cervical spine were unremarkable. EEG is pending to rule out seizure, but the most likely etiology is migraine with aura. She is currently having about 4-5 headaches per month that respond well to Sumatriptan. I will add a preventative today. She could not tolerate propranolol previously and is already on an SSRI (Lexapro). I will start with Topamax for this reason.   Plan: -EEG - will make sure this gets scheduled today -Migraine plan: Migraine prevention:  Topamax 25 mg daily for 2 weeks, 50 mg daily thereafter Migraine rescue:  Sumatriptan 100 mg PRN Limit use of pain relievers to no more than 2 days out of week to prevent risk of rebound or medication-overuse headache. Keep headache diary Follow up 3 months  Since their last visit: EEG on 09/17/22 was normal with no evidence of seizure or abnormal discharges to suggest possible increased risk of seizure. She continues to have left sided numbness/tingling with chest pain. This has not changed much.  Patient started topamax 25 mg, but was not able to increase 50 mg due to it causing bladder problems. She has been on only 25 mg 11/24/22. She got a big headache ~12/14/22. She thinks topamax has eliminated warning signs of headaches. She is having 2 small headaches per month and only had 1 big headache since last visit (09/16/22). She took Imitrex only with the big headache. She did not check the headache early, so it did not work well. She did not redose. That headache lasted about 1 day. She spent all day in bed.  Overall, patient is not sure she wants to continue topamax due to side effects and UTIs.  MEDICATIONS:  Outpatient Encounter Medications as of 12/25/2022  Medication Sig   escitalopram (LEXAPRO) 10 MG tablet Take 10 mg by mouth daily.   metoprolol succinate (TOPROL-XL) 25 MG 24 hr tablet Take 25 mg by mouth daily.   pantoprazole (PROTONIX) 40 MG tablet Take 40 mg by mouth daily.   SUMAtriptan (IMITREX) 100 MG tablet Take  1 tablet (100 mg total) by mouth once as needed for up to 1 dose for migraine. May repeat in 2 hours if headache persists or recurs.   topiramate (TOPAMAX) 25 MG tablet Take 2 tablets (50 mg total) by mouth daily. Take 1 tablet (25 mg) daily for 2 weeks, then if no side effects, increase to 2 tablets (50 mg) daily thereafter.   [DISCONTINUED] cephALEXin (KEFLEX) 500 MG capsule Take 1 capsule (500 mg total) by mouth 2 (two) times daily.   [DISCONTINUED] fluticasone (FLONASE) 50 MCG/ACT nasal spray Place 2 sprays into both nostrils daily. For nose congestion, allergies and sinus inflammation. Then repeat prn   No facility-administered encounter medications on file as of 12/25/2022.    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   Irregular heart rate    Medical history non-contributory     PAST SURGICAL HISTORY: Past Surgical History:  Procedure Laterality Date   LAPAROSCOPIC ASSISTED VAGINAL HYSTERECTOMY N/A 01/28/2022   Procedure: LAPAROSCOPIC ASSISTED VAGINAL HYSTERECTOMY;  Surgeon: Myna Hidalgo, DO;  Location: [facility];  Service: Gynecology;  Laterality: N/A;   LAPAROSCOPIC BILATERAL SALPINGECTOMY Bilateral 01/28/2022   Procedure: LAPAROSCOPIC BILATERAL SALPINGECTOMY;  Surgeon: Myna Hidalgo, DO;  Location: [facility];  Service: Gynecology;  Laterality: Bilateral;   NO PAST SURGERIES      ALLERGIES: Allergies  Allergen Reactions   Hydrocortisone Rash    cream    FAMILY HISTORY: Family History  Problem Relation Age of Onset   Cancer Mother        ovarian   Hypertension Father    Varicose Veins Father        skin   Cancer Father    Cancer Paternal Grandmother        breast    SOCIAL HISTORY: Social History   Tobacco Use   Smoking status: Never   Smokeless tobacco: Never  Vaping Use   Vaping Use: Never used  Substance Use Topics   Alcohol use: Yes    Comment: rare    Drug use: No   Social History   Social History Narrative   Are you right handed or left handed? Right handed   Are you currently employed ? 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 no   Who lives with you? Family    What type of home do you live in: 1 story or 2 story? 1 story     Caffeine 1-2 cups      Objective:  Vital Signs:  BP 114/72   Pulse 77   Ht 5\' 6"  (1.676 m)   Wt 158 lb (71.7 kg)   LMP 10/08/2021 Comment: negative urine test on 01/26/22  SpO2 99%   BMI 25.50 kg/m   General: No acute distress.  Patient appears well-groomed.   Head:  Normocephalic/atraumatic Eyes:  fundi examined, disc margins clear, no obvious papilledema Neck: supple, no paraspinal tenderness, full range of motion Back: No paraspinal tenderness Heart: regular rate and rhythm Lungs: Clear to auscultation bilaterally. Vascular: No carotid bruits.  Neurological Exam: Mental status: alert and oriented to person, place, and time, speech fluent and not dysarthric, language intact.  Cranial nerves: CN I: not tested CN II: pupils equal, round and reactive to light, visual fields intact CN III, IV, VI:  full range of motion, no nystagmus, no ptosis CN V: facial sensation intact. CN VII: upper and lower face symmetric CN VIII: hearing intact CN IX, X: gag intact, uvula midline CN XI: sternocleidomastoid and trapezius muscles intact CN XII: tongue midline  Bulk & Tone: normal, no fasciculations. Motor:  muscle strength 5/5 throughout Deep Tendon Reflexes:  2+ throughout,  toes downgoing.   Sensation:  Pinprick, temperature and vibratory sensation intact. Finger to nose testing:  Without dysmetria.   Heel to shin:  Without dysmetria.   Gait:  Normal station and stride.  Romberg negative.  Labs and Imaging review: New results: EEG (09/17/22): Description: The patient is awake and asleep during the recording.  During maximal wakefulness, there is a  symmetric, medium voltage 10 Hz posterior dominant rhythm that attenuates with eye opening.  The record is symmetric.  During drowsiness and sleep, there is an increase in theta slowing of the background.  Vertex waves and symmetric sleep spindles were seen. Hyperventilation and photic stimulation did not elicit any abnormalities.  There were no epileptiform  discharges or electrographic seizures seen.     EKG lead was unremarkable.   Impression: This 1-hour awake and asleep EEG is normal.    Previously reviewed results: Normal or unremarkable: CBC, CMP (03/08/22) B12 (05/09/18): 663 TSH (04/25/18): 1.910   External labs: Lab work by Rheumatology:    Cervical spine xray (12/02/20): FNDINGS: There is no evidence of cervical spine fracture or prevertebral soft tissue swelling. Alignment is normal. No other significant bone abnormalities are identified.   IMPRESSION: Negative cervical spine radiographs.  MRI brain w/wo contrast (08/14/22): FINDINGS: Brain:   No age advanced or lobar predominant parenchymal atrophy.   Nonspecific 3 mm focus of T2 FLAIR hyperintense signal abnormality within the anterior right frontal lobe subcortical white matter (series 4, image 11) (series 9, image 15).   No cortical encephalomalacia is identified.   There is no acute infarct.   No evidence of an intracranial mass.   No chronic intracranial blood products.   No extra-axial fluid collection.   No midline shift.   No pathologic intracranial enhancement identified.   Vascular: Maintained flow voids within the proximal large arterial vessels.   Skull and upper cervical spine: No focal suspicious marrow lesion.   Sinuses/Orbits: No mass or acute finding within the imaged orbits. Tiny mucous retention cyst within the right maxillary sinus.   IMPRESSION: No evidence of acute intracranial abnormality.   Nonspecific 3 mm T2 FLAIR hyperintense remote insult within the anterior right  frontal lobe white matter. Otherwise unremarkable MRI appearance of the brain.   MRI cervical spine w/wo contrast (08/14/22): FINDINGS: Intermittently motion degraded examination (with up to moderate motion degradation of the acquired sequences).   Alignment: No significant spondylolisthesis.   Vertebrae: Vertebral body height is maintained. No significant marrow edema or focal suspicious osseous lesion. Hemangioma within the C4 vertebral body.   Cord: Within limitations of motion degradation, no signal abnormality is identified within the cervical spinal cord. No pathologic spinal cord enhancement.   Posterior Fossa, vertebral arteries, paraspinal tissues: Posterior fossa assessed on same-day brain MRI. Flow voids preserved within the imaged cervical vertebral arteries. No paraspinal mass or collection.   Disc levels:   Mild multilevel disc degeneration.   C2-C3: Shallow disc bulge. No significant spinal canal or foraminal stenosis.   C3-C4: Shallow disc bulge. Mild uncovertebral hypertrophy on the right. No significant spinal canal or foraminal stenosis.   C4-C5: Shallow disc bulge. No significant spinal canal or foraminal stenosis.   C5-C6: Disc bulge with superimposed small left center disc protrusion. Mild effacement of the ventral thecal sac (without spinal cord mass effect). No significant foraminal stenosis.   C6-C7: Shallow disc bulge. No significant spinal canal or foraminal stenosis.   C7-T1: No significant disc herniation or stenosis.   IMPRESSION: Intermittently motion degraded exam.   Cervical spondylosis, as outlined. No more than mild relative spinal canal narrowing. No significant foraminal stenosis. Mild multilevel disc degeneration.   No lesion is identified within the cervical spinal cord.  Assessment/Plan:  This is Tina Osborn, a 37 y.o. female with: Episodic migraine with aura - she could not tolerate propranolol previously and is now  having difficulty with topamax, causing UTI at higher, more effective doses. She does not want to do an injectable, so I will order Nurtec for prevention and continue sumatriptan for rescue. Episodic left chest and arm paresthesias - This is of unclear etiology. Cardiac work up was reportedly normal. MRI brain and C spine were normal as was EEG.   Plan:  Migraine prevention:  Topamax 25 mg daily for now. Will order Nurtec 75 mg every other day and begin approval process. Patient will stop Topamax and start Nurtec when approved. Migraine rescue:  Sumatriptan 100 mg PRN at headache onset, can repeat after 2 hours if needed Limit use of pain relievers to no more than 2 days out of week to prevent risk of rebound or medication-overuse headache. Keep headache diary Follow up 6 in months  Total time spent reviewing records, interview, history/exam, documentation, and coordination of care on day of encounter:  35 min  Jacquelyne Balint, MD

## 2022-12-25 ENCOUNTER — Telehealth: Payer: Self-pay

## 2022-12-25 ENCOUNTER — Encounter: Payer: Self-pay | Admitting: Neurology

## 2022-12-25 ENCOUNTER — Ambulatory Visit: Payer: BC Managed Care – PPO | Admitting: Neurology

## 2022-12-25 VITALS — BP 114/72 | HR 77 | Ht 66.0 in | Wt 158.0 lb

## 2022-12-25 DIAGNOSIS — R29818 Other symptoms and signs involving the nervous system: Secondary | ICD-10-CM | POA: Diagnosis not present

## 2022-12-25 DIAGNOSIS — R200 Anesthesia of skin: Secondary | ICD-10-CM

## 2022-12-25 DIAGNOSIS — G43109 Migraine with aura, not intractable, without status migrainosus: Secondary | ICD-10-CM

## 2022-12-25 DIAGNOSIS — R531 Weakness: Secondary | ICD-10-CM

## 2022-12-25 DIAGNOSIS — R202 Paresthesia of skin: Secondary | ICD-10-CM

## 2022-12-25 MED ORDER — NURTEC 75 MG PO TBDP
75.0000 mg | ORAL_TABLET | ORAL | 11 refills | Status: AC
Start: 2022-12-25 — End: 2023-12-25

## 2022-12-25 NOTE — Patient Instructions (Signed)
Migraine prevention:  Continue Topamax 25 mg daily for now. Will order Nurtec 75 mg every other day and begin approval process. Patient will stop Topamax and start Nurtec when approved. Migraine rescue:  Sumatriptan 100 mg PRN at headache onset, can repeat after 2 hours if needed Limit use of pain relievers to no more than 2 days out of week to prevent risk of rebound or medication-overuse headache. Keep headache diary Follow up 6 in months or sooner if needed  We will be in touch when the Nurtec is approved. It will likely take about 1 month.  The physicians and staff at [facility] are committed to providing excellent care. You may receive a survey requesting feedback about your experience at our office. We strive to receive "very good" responses to the survey questions. If you feel that your experience would prevent you from giving the office a "very good " response, please contact our office to try to remedy the situation. We may be reached at [phone]. Thank you for taking the time out of your busy day to complete the survey.  Jacquelyne Balint, MD [facility]

## 2022-12-25 NOTE — Telephone Encounter (Signed)
Per Dr.Hill, I ordered Nurtec 75 mg every other day for migraine prevention for this patient. She has already tried 2 medications (propranolol and topamax). Can you help by letting the PA know so we can get this approved for the patient?   Thank you,

## 2022-12-30 ENCOUNTER — Telehealth: Payer: Self-pay | Admitting: Pharmacy Technician

## 2022-12-30 ENCOUNTER — Other Ambulatory Visit (HOSPITAL_COMMUNITY): Payer: Self-pay

## 2022-12-30 NOTE — Telephone Encounter (Signed)
Status of PA in separate encounter 

## 2022-12-30 NOTE — Telephone Encounter (Signed)
Pharmacy Patient Advocate Encounter   Received notification from Pt Calls Messages that prior authorization for Nurtec 75MG  dispersible tablets is required/requested.    PA submitted to [facility] via PA options: CoverMyMeds Key/confirmation #/EOC [id number] Status is pending

## 2023-01-03 NOTE — Telephone Encounter (Signed)
Patient Advocate Encounter  Received a fax from [employer] regarding Prior Authorization for Nurtec 75MG  dispersible tablets.   Key: [id number]  Authorization has been DENIED due to    Determination letter attached to patient chart

## 2023-01-04 ENCOUNTER — Other Ambulatory Visit: Payer: Self-pay | Admitting: Neurology

## 2023-01-22 NOTE — Telephone Encounter (Signed)
Patient Advocate Encounter  Re-submitted: 01-22-2023 Key: Orvan Seen

## 2023-02-03 NOTE — Telephone Encounter (Signed)
PA has been DENIED due to:     Full denial letter attached in patients media

## 2023-02-26 ENCOUNTER — Encounter: Payer: Self-pay | Admitting: Neurology

## 2023-03-03 ENCOUNTER — Other Ambulatory Visit: Payer: Self-pay | Admitting: Neurology

## 2023-03-04 ENCOUNTER — Telehealth: Payer: Self-pay | Admitting: Neurology

## 2023-03-04 NOTE — Telephone Encounter (Signed)
Called patient to discuss headaches. She is having intermittent dull and sharp headaches for the last few days. I have a peer to peer scheduled for this afternoon to appeal the denial of her Nurtec for migraine prevention. Patient would prefer to wait to see how this goes then maybe consider Medrol dosepak vs migraine cocktail in the office to break her current headache cycle.  All questions were answered.  Jacquelyne Balint, MD [facility]

## 2023-03-04 NOTE — Telephone Encounter (Signed)
Called patient to let her know that I did the peer to peer with her insurance. Her Nurtec was approved after that conversation. I was told patient could call her pharmacy in 1-2 hours and get the medication, which I relayed to patient.  She will let me know if she has any problems getting the medication or headaches do not improve.  All questions were answered.  Jacquelyne Balint, MD [facility]

## 2023-05-22 ENCOUNTER — Telehealth: Payer: BC Managed Care – PPO | Admitting: Family

## 2023-05-22 DIAGNOSIS — R399 Unspecified symptoms and signs involving the genitourinary system: Secondary | ICD-10-CM

## 2023-05-22 MED ORDER — CEPHALEXIN 500 MG PO CAPS
500.0000 mg | ORAL_CAPSULE | Freq: Two times a day (BID) | ORAL | 0 refills | Status: DC
Start: 2023-05-22 — End: 2023-12-29

## 2023-05-22 NOTE — Progress Notes (Signed)
E-Visit for Urinary Problems  We are sorry that you are not feeling well.  Here is how we plan to help!  Based on what you shared with me it looks like you most likely have a simple urinary tract infection.  A UTI (Urinary Tract Infection) is a bacterial infection of the bladder.  Most cases of urinary tract infections are simple to treat but a key part of your care is to encourage you to drink plenty of fluids and watch your symptoms carefully.  I have prescribed Keflex 500 mg twice a day for 7 days.  Your symptoms should gradually improve. Call us if the burning in your urine worsens, you develop worsening fever, back pain or pelvic pain or if your symptoms do not resolve after completing the antibiotic.  Urinary tract infections can be prevented by drinking plenty of water to keep your body hydrated.  Also be sure when you wipe, wipe from front to back and don't hold it in!  If possible, empty your bladder every 4 hours.  HOME CARE Drink plenty of fluids Compete the full course of the antibiotics even if the symptoms resolve Remember, when you need to go.go. Holding in your urine can increase the likelihood of getting a UTI! GET HELP RIGHT AWAY IF: You cannot urinate You get a high fever Worsening back pain occurs You see blood in your urine You feel sick to your stomach or throw up You feel like you are going to pass out  MAKE SURE YOU  Understand these instructions. Will watch your condition. Will get help right away if you are not doing well or get worse.   Thank you for choosing an e-visit.  Your e-visit answers were reviewed by a board certified advanced clinical practitioner to complete your personal care plan. Depending upon the condition, your plan could have included both over the counter or prescription medications.  Please review your pharmacy choice. Make sure the pharmacy is open so you can pick up prescription now. If there is a problem, you may contact your  provider through MyChart messaging and have the prescription routed to another pharmacy.  Your safety is important to us. If you have drug allergies check your prescription carefully.   For the next 24 hours you can use MyChart to ask questions about today's visit, request a non-urgent call back, or ask for a work or school excuse. You will get an email in the next two days asking about your experience. I hope that your e-visit has been valuable and will speed your recovery.  Approximately 5 minutes was spent documenting and reviewing patient's chart.    

## 2023-06-10 ENCOUNTER — Telehealth: Payer: Self-pay

## 2023-06-10 ENCOUNTER — Telehealth: Payer: Self-pay | Admitting: Neurology

## 2023-06-10 NOTE — Telephone Encounter (Signed)
1. Which medications need refilled? (List name and dosage, if known) NURTEC  2. Which pharmacy/location is medication to be sent to? (include street and city if local pharmacy) [facility] [place]

## 2023-06-10 NOTE — Telephone Encounter (Signed)
Resent a PA for patient.

## 2023-06-22 ENCOUNTER — Other Ambulatory Visit (HOSPITAL_COMMUNITY): Payer: Self-pay

## 2023-06-22 ENCOUNTER — Telehealth: Payer: Self-pay | Admitting: Pharmacy Technician

## 2023-06-22 NOTE — Telephone Encounter (Signed)
Pharmacy Patient Advocate Encounter   Received notification from CoverMyMeds that prior authorization for NURTEC 75MG  is required/requested.   Insurance verification completed.   The patient is insured through [facility] .   Per test claim: PA required; PA submitted to above mentioned insurance via CoverMyMeds Key/confirmation #/EOC [id number] Status is pending

## 2023-06-22 NOTE — Progress Notes (Signed)
NEUROLOGY FOLLOW UP OFFICE NOTE  Tina Osborn [id number]  Subjective:  Tina Osborn is a 37 y.o. year old right-handed female with a medical history of migraines, tachycardia, anxiety, and GERD who we last saw on 12/25/22 for migraines.  To briefly review: Patient has had weakness and tachycardia for 2 years. The weakness was only on the left side (arm and leg). She saw her PCP who tried treating anxiety and reflux that helped those symptoms but not the pain, weakness, numbness, or tachycardia. There was concern it was cardiac so she saw cardiology and had multiple EKGs, Holter monitor, echocardiogram, and stress test. These were normal except the stress test had to be stopped early due to tachycardia. CT PE was negative on 03/08/22 as was CXR on 07/06/22.   Her weakness comes and goes. Sometimes it lasts 5 minutes, sometimes it lasts an hour. It occurs about every few days. She also has numbness and pain on the left. She has sharp, stabbing pain. It can take her breath away. She will have stabbing pain then achiness. It can last 5 minutes or can last days. The numbness and weakness usually come together, but the pain can be with or without the weakness. When she has pain, she can see her left pectoral muscle twitching.   Symptoms have been stable for 2 years.   Patient has migraines with aura. She may or may not always have a headache. She can lose vision in her left eye, have photophobia, phonophobia. She gets one headache per month since teenage years. Father also has migraines. She takes Sumatriptan PRN for the migraines. She may have had a headache associated with her weakness. She has not taken it when she has a weakness episode.   Patient had lab work ordered by [facility] including ESR, CRP, and CK, but I cannot see if she has had this drawn. Per patient these were normal. She has been diagnosed with raynaud's.   The patient has not noticed any recent skin rashes nor  does she report any constitutional symptoms like fever, night sweats, anorexia or unintentional weight loss.   She denies significant trauma to her head. She has scars on her head but does not know what they are. She denies developmental delay. She denies previous brain infection. She was in the [employer].   EtOH use: Rare  Restrictive diet? No Family history of neuropathy/myopathy/neurologic disease? Father has similar symptoms with left sided weakness   Lives with husband, and 2 children (20 and 78 years old) Works as Advertising account planner   09/16/22: MRI cervical spine showed no concerning pathology. MRI brain was significant for a nonspecific 3 mm T2 hyperintensity in her anterior right frontal lobe subcortical white matter. These lesion would not definitively explain patient's symptoms, so decision was made to pursue EEG. She has not gotten a call about scheduling this.   Patient had an episode last night. Around 7 pm she had pain in her chest (tightness on the left pectoral muscle area). This was coming and going most of the night. She did not having the tingling and numbness in her arm. She woke up this morning to a full migraine. She did not take Imitrex as she cannot go to work when she takes it due to sleepiness. Her headache today is similar to prior with photophobia, phonophobia, and some difficulty with finding words.   She is having about 1 headache per week. She takes sumatriptan and naps for 2 hours and can then be  symptom free. There is no positional component to headaches and no vision changes.   Patient did try to take sumatriptan during an episode of numbness and tingling on the left side, but it did not clearly help.   She denies history of kidney stones. She has previously tried propranolol but had sun sensitivity.   Topamax 50 mg daily was started on 09/16/22.  12/25/22: EEG on 09/17/22 was normal with no evidence of seizure or abnormal discharges to suggest possible increased risk  of seizure. She continues to have left sided numbness/tingling with chest pain. This has not changed much.   Patient started topamax 25 mg, but was not able to increase 50 mg due to it causing bladder problems. She has been on only 25 mg 11/24/22. She got a big headache ~12/14/22. She thinks topamax has eliminated Osborn signs of headaches. She is having 2 small headaches per month and only had 1 big headache since last visit (09/16/22). She took Imitrex only with the big headache. She did not check the headache early, so it did not work well. She did not redose. That headache lasted about 1 day. She spent all day in bed.   Overall, patient is not sure she wants to continue topamax due to side effects and UTIs.  Most recent Assessment and Plan (12/25/22): This is Tina Osborn, a 37 y.o. female with: Episodic migraine with aura - she could not tolerate propranolol previously and is now having difficulty with topamax, causing UTI at higher, more effective doses. She does not want to do an injectable, so I will order Nurtec for prevention and continue sumatriptan for rescue. Episodic left chest and arm paresthesias - This is of unclear etiology. Cardiac work up was reportedly normal. MRI brain and C spine were normal as was EEG.    Plan: Migraine prevention:  Topamax 25 mg daily for now. Will order Nurtec 75 mg every other day and begin approval process. Patient will stop Topamax and start Nurtec when approved. Migraine rescue:  Sumatriptan 100 mg PRN at headache onset, can repeat after 2 hours if needed Limit use of pain relievers to no more than 2 days out of week to prevent risk of rebound or medication-overuse headache. Keep headache diary Follow up 6 in months  Since their last visit: Per my telephone notes from 03/04/23: Called patient to discuss headaches. She is having intermittent dull and sharp headaches for the last few days. I have a peer to peer scheduled for this afternoon to appeal the  denial of her Nurtec for migraine prevention. Patient would prefer to wait to see how this goes then maybe consider Medrol dosepak vs migraine cocktail in the office to break her current headache cycle.   Called patient to let her know that I did the peer to peer with her insurance. Her Nurtec was approved after that conversation. I was told patient could call her pharmacy in 1-2 hours and get the medication, which I relayed to patient.   She will let me know if she has any problems getting the medication or headaches do not improve.  Patient then needed another PA on 06/10/23 for Nurtec - approved 06/23/23. Patient has being doing well with the Nurtec. When she had the medication, she was only getting headaches every other week. When she gets a headache it is minor. The Sumatriptan gets rid of her headaches.  Her left chest and arm has improved and is only sporadic, about the same frequency as headaches when  on Nurtec.  She has no new complaints.  MEDICATIONS:  Outpatient Encounter Medications as of 06/30/2023  Medication Sig   cephALEXin (KEFLEX) 500 MG capsule Take 1 capsule (500 mg total) by mouth 2 (two) times daily.   escitalopram (LEXAPRO) 10 MG tablet Take 10 mg by mouth daily.   metoprolol succinate (TOPROL-XL) 25 MG 24 hr tablet Take 25 mg by mouth daily.   pantoprazole (PROTONIX) 40 MG tablet Take 40 mg by mouth daily.   Rimegepant Sulfate (NURTEC) 75 MG TBDP Take 1 tablet (75 mg total) by mouth every other day.   SUMAtriptan (IMITREX) 100 MG tablet Take 1 tablet (100 mg total) by mouth once as needed for up to 1 dose for migraine. May repeat in 2 hours if headache persists or recurs.   topiramate (TOPAMAX) 25 MG tablet Take 2 tablets (50 mg total) by mouth daily. Take 1 tablet (25 mg) daily for 2 weeks, then if no side effects, increase to 2 tablets (50 mg) daily thereafter.   No facility-administered encounter medications on file as of 06/30/2023.    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   Irregular heart rate    Medical history non-contributory     PAST SURGICAL HISTORY: Past Surgical History:  Procedure Laterality Date   LAPAROSCOPIC ASSISTED VAGINAL HYSTERECTOMY N/A 01/28/2022   Procedure: LAPAROSCOPIC ASSISTED VAGINAL HYSTERECTOMY;  Surgeon: Myna Hidalgo, DO;  Location: [facility];  Service: Gynecology;  Laterality: N/A;   LAPAROSCOPIC BILATERAL SALPINGECTOMY Bilateral 01/28/2022   Procedure: LAPAROSCOPIC BILATERAL SALPINGECTOMY;  Surgeon: Myna Hidalgo, DO;  Location: [facility];  Service: Gynecology;  Laterality: Bilateral;   NO PAST SURGERIES      ALLERGIES: Allergies  Allergen Reactions   Hydrocortisone Rash    cream    FAMILY HISTORY: Family History  Problem Relation Age of Onset   Cancer Mother        ovarian   Hypertension Father    Varicose Veins Father        skin   Cancer Father    Cancer Paternal Grandmother        breast    SOCIAL HISTORY: Social History   Tobacco Use   Smoking status: Never   Smokeless tobacco: Never  Vaping Use   Vaping status: Never Used  Substance Use Topics   Alcohol use: Yes    Comment: rare   Drug use: No   Social History   Social History Narrative   Are you right handed or left handed? Right handed   Are you currently employed ? 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 no   Who lives with you? Family    What type of home do you live in: 1 story or 2 story? 1 story     Caffeine 1-2 cups      Objective:  Vital Signs:  BP 118/82   Pulse (!) 56   Ht 5\' 6"  (1.676 m)   Wt 166 lb (75.3 kg)   LMP 10/08/2021 Comment: negative urine test on 01/26/22  SpO2 100%   BMI 26.79 kg/m   General: No acute distress.  Patient appears well-groomed.   Head:  Normocephalic/atraumatic Eyes:  fundi examined, disc margins clear, no obvious papilledema Neck:  supple, no paraspinal tenderness, full range of motion Lungs: Non-labored breathing on room air   Neurological  Exam: Mental status: alert and oriented, speech fluent and not dysarthric, language intact.  Cranial nerves: CN I: not tested CN II: pupils equal, round and reactive to light, visual fields intact CN III, IV, VI:  full range of motion, no nystagmus, no ptosis CN V: facial sensation intact. CN VII: upper and lower face symmetric CN VIII: hearing intact CN IX, X: uvula midline CN XI: sternocleidomastoid and trapezius muscles intact CN XII: tongue midline  Bulk & Tone: normal, no fasciculations. Motor:  muscle strength 5/5 throughout Sensation:  Light touch sensation intact. Finger to nose testing:  Without dysmetria.    Gait:  Normal station and stride.  Labs and Imaging review: No new results  Previously reviewed results: Normal or unremarkable: CBC, CMP (03/08/22) B12 (05/09/18): 663 TSH (04/25/18): 1.910   External labs: Lab work by Rheumatology:    Cervical spine xray (12/02/20): FNDINGS: There is no evidence of cervical spine fracture or prevertebral soft tissue swelling. Alignment is normal. No other significant bone abnormalities are identified.   IMPRESSION: Negative cervical spine radiographs.   MRI brain w/wo contrast (08/14/22): FINDINGS: Brain:   No age advanced or lobar predominant parenchymal atrophy.   Nonspecific 3 mm focus of T2 FLAIR hyperintense signal abnormality within the anterior right frontal lobe subcortical white matter (series 4, image 11) (series 9, image 15).   No cortical encephalomalacia is identified.   There is no acute infarct.   No evidence of an intracranial mass.   No chronic intracranial blood products.   No extra-axial fluid collection.   No midline shift.   No pathologic intracranial enhancement identified.   Vascular: Maintained flow voids within the proximal large arterial vessels.   Skull and upper  cervical spine: No focal suspicious marrow lesion.   Sinuses/Orbits: No mass or acute finding within the imaged orbits. Tiny mucous retention cyst within the right maxillary sinus.   IMPRESSION: No evidence of acute intracranial abnormality.   Nonspecific 3 mm T2 FLAIR hyperintense remote insult within the anterior right frontal lobe white matter. Otherwise unremarkable MRI appearance of the brain.   MRI cervical spine w/wo contrast (08/14/22): FINDINGS: Intermittently motion degraded examination (with up to moderate motion degradation of the acquired sequences).   Alignment: No significant spondylolisthesis.   Vertebrae: Vertebral body height is maintained. No significant marrow edema or focal suspicious osseous lesion. Hemangioma within the C4 vertebral body.   Cord: Within limitations of motion degradation, no signal abnormality is identified within the cervical spinal cord. No pathologic spinal cord enhancement.   Posterior Fossa, vertebral arteries, paraspinal tissues: Posterior fossa assessed on same-day brain MRI. Flow voids preserved within the imaged cervical vertebral arteries. No paraspinal mass or collection.   Disc levels:   Mild multilevel disc degeneration.   C2-C3: Shallow disc bulge. No significant spinal canal or foraminal stenosis.   C3-C4: Shallow disc bulge. Mild uncovertebral hypertrophy on the right. No significant spinal canal or foraminal stenosis.   C4-C5: Shallow disc bulge. No significant spinal canal or foraminal stenosis.   C5-C6: Disc bulge with superimposed small left center disc protrusion. Mild effacement of the ventral thecal sac (without spinal cord mass effect). No significant foraminal stenosis.   C6-C7: Shallow disc bulge. No significant spinal canal or foraminal stenosis.   C7-T1: No significant disc herniation or stenosis.   IMPRESSION: Intermittently motion degraded exam.   Cervical spondylosis, as outlined. No more  than mild relative spinal canal narrowing. No significant foraminal stenosis. Mild multilevel disc degeneration.   No lesion  is identified within the cervical spinal cord.  EEG (09/17/22): Description: The patient is awake and asleep during the recording.  During maximal wakefulness, there is a symmetric, medium voltage 10 Hz posterior dominant rhythm that attenuates with eye opening.  The record is symmetric.  During drowsiness and sleep, there is an increase in theta slowing of the background.  Vertex waves and symmetric sleep spindles were seen. Hyperventilation and photic stimulation did not elicit any abnormalities.  There were no epileptiform discharges or electrographic seizures seen.     EKG lead was unremarkable.   Impression: This 1-hour awake and asleep EEG is normal.    Assessment/Plan:  This is Tina Osborn, a 37 y.o. female with: Episodic migraine with aura - she could not tolerate propranolol previously and is now having difficulty with topamax, causing UTI at higher, more effective doses. She does not want to do an injectable, started Nurtec for prevention. She is doing well with sumatriptan for rescue. Episodic left chest and arm paresthesias - This is of unclear etiology. Cardiac work up was reportedly normal. MRI brain and C spine were normal, as was EEG. This has improved with headaches while on Nurtec, so possibly migraine related (?)   Plan: Migraine prevention:  Nurtec 75 mg every other day Migraine rescue:  Sumatriptan 100 mg PRN at headache onset, can repeat after 2 hours if needed  Limit use of pain relievers to no more than 2 days out of week to prevent risk of rebound or medication-overuse headache. Keep headache diary  Return to clinic in 6 months  Jacquelyne Balint, MD

## 2023-06-23 ENCOUNTER — Other Ambulatory Visit (HOSPITAL_COMMUNITY): Payer: Self-pay

## 2023-06-23 NOTE — Telephone Encounter (Signed)
Pharmacy Patient Advocate Encounter  Received notification from [facility] that Prior Authorization for NURTEC 75MG  has been APPROVED from 12.4.24 to 12.3.25. Ran test claim, Copay is $0. This test claim was processed through [facility]- copay amounts may vary at other pharmacies due to pharmacy/plan contracts, or as the patient moves through the different stages of their insurance plan.   PA #/Case ID/Reference #: [id number]

## 2023-06-23 NOTE — Telephone Encounter (Signed)
Called patient and let her know her nurtec was approved.

## 2023-06-30 ENCOUNTER — Encounter: Payer: Self-pay | Admitting: Neurology

## 2023-06-30 ENCOUNTER — Ambulatory Visit: Payer: BC Managed Care – PPO | Admitting: Neurology

## 2023-06-30 VITALS — BP 118/82 | HR 56 | Ht 66.0 in | Wt 166.0 lb

## 2023-06-30 DIAGNOSIS — G43109 Migraine with aura, not intractable, without status migrainosus: Secondary | ICD-10-CM

## 2023-06-30 DIAGNOSIS — R29818 Other symptoms and signs involving the nervous system: Secondary | ICD-10-CM

## 2023-06-30 DIAGNOSIS — R200 Anesthesia of skin: Secondary | ICD-10-CM | POA: Diagnosis not present

## 2023-06-30 DIAGNOSIS — R202 Paresthesia of skin: Secondary | ICD-10-CM

## 2023-06-30 NOTE — Patient Instructions (Signed)
Migraine prevention:  Nurtec 75 mg every other day Migraine rescue:  Sumatriptan 100 mg at onset at headache onset, can repeat after 2 hours if needed  Limit use of pain relievers to no more than 2 days out of week to prevent risk of rebound or medication-overuse headache. Keep headache diary  Return to clinic in 6 months  Please let me know if you have any questions or concerns in the meantime.   The physicians and staff at [facility] are committed to providing excellent care. You may receive a survey requesting feedback about your experience at our office. We strive to receive "very good" responses to the survey questions. If you feel that your experience would prevent you from giving the office a "very good " response, please contact our office to try to remedy the situation. We may be reached at [phone]. Thank you for taking the time out of your busy day to complete the survey.  Jacquelyne Balint, MD [facility]

## 2023-09-14 IMAGING — US US PELVIS COMPLETE WITH TRANSVAGINAL
1 series · 13 of 25 positions shown · non-contrast
Comparison: None Available.

CLINICAL DATA: Pelvic pain x3 months, dysmenorrhea

EXAM:
TRANSABDOMINAL AND TRANSVAGINAL ULTRASOUND OF PELVIS
TECHNIQUE: Both transabdominal and transvaginal ultrasound examinations of the
pelvis were performed. Transabdominal technique was performed for
global imaging of the pelvis including uterus, ovaries, adnexal
regions, and pelvic cul-de-sac. It was necessary to proceed with
endovaginal exam following the transabdominal exam to visualize the
endometrium and ovaries.

[Series 1: us pelvic complete with transvaginal · 13 of 102 slices shown]
[im 1/102]
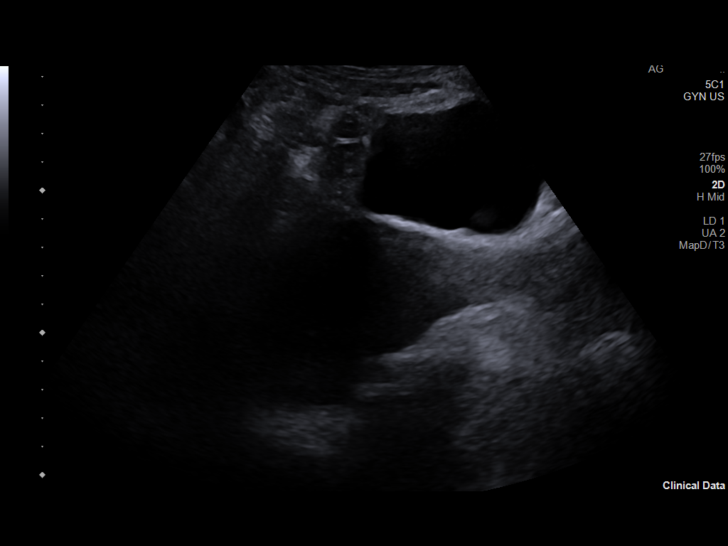
[im 9/102]
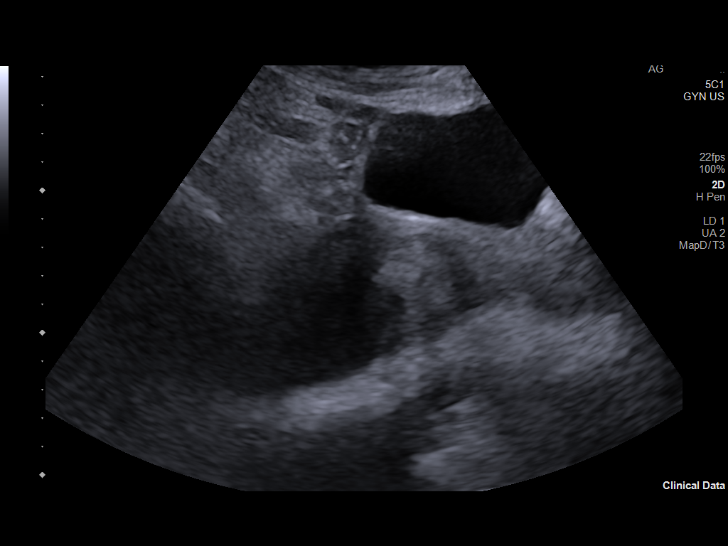
[im 17/102]
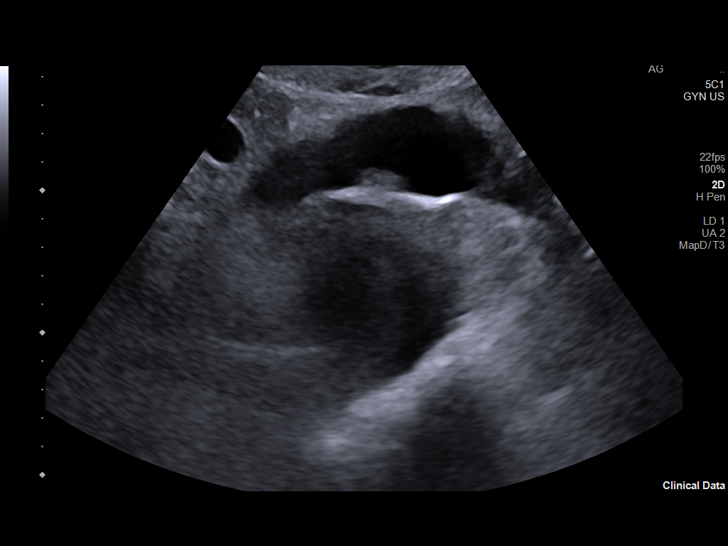
[im 26/102]
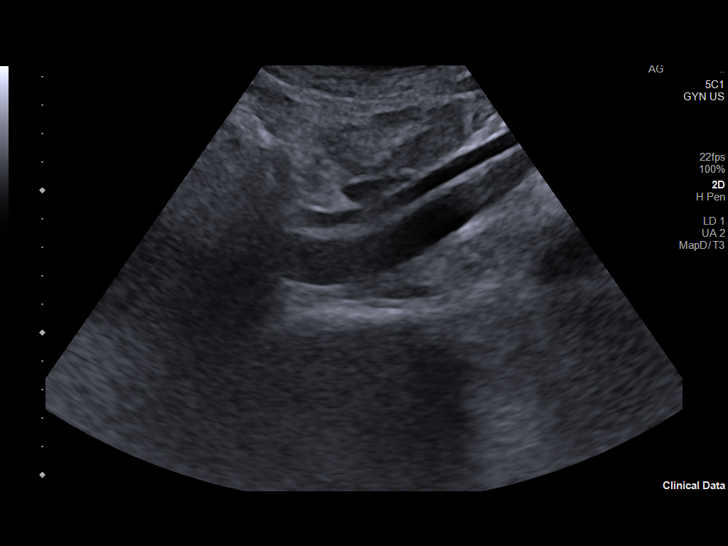
[im 34/102]
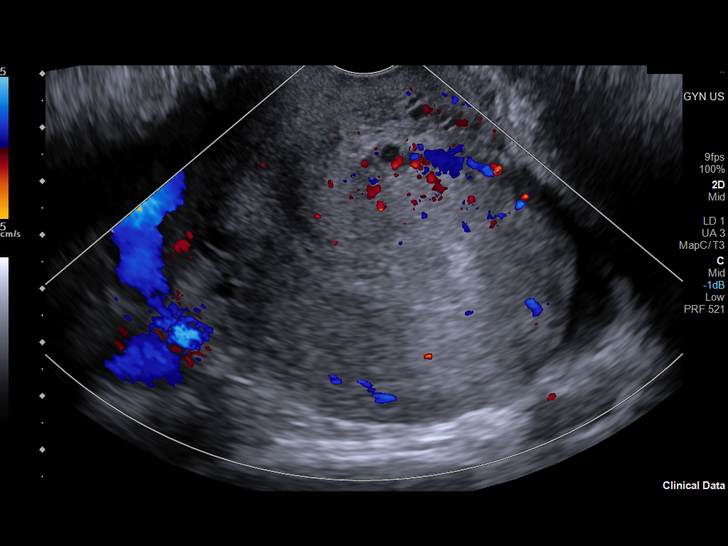
[im 43/102]
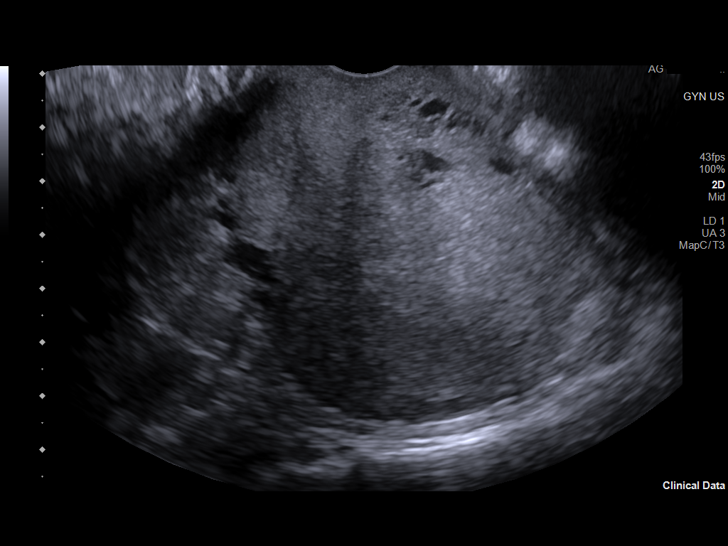
[im 51/102]
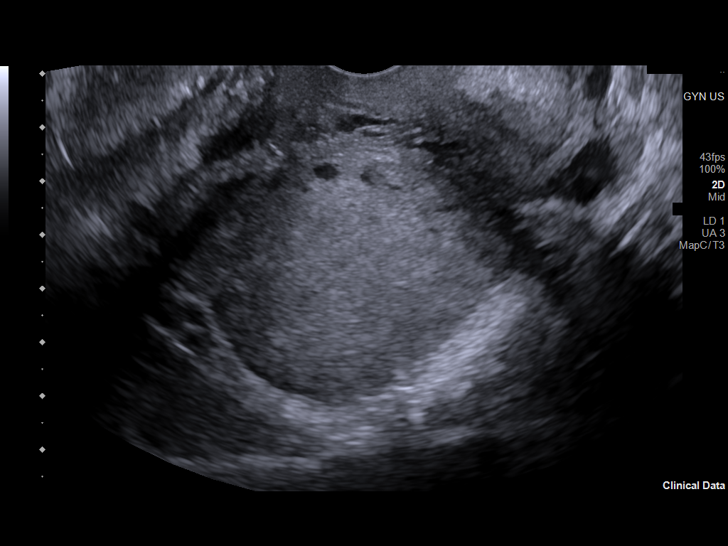
[im 59/102]
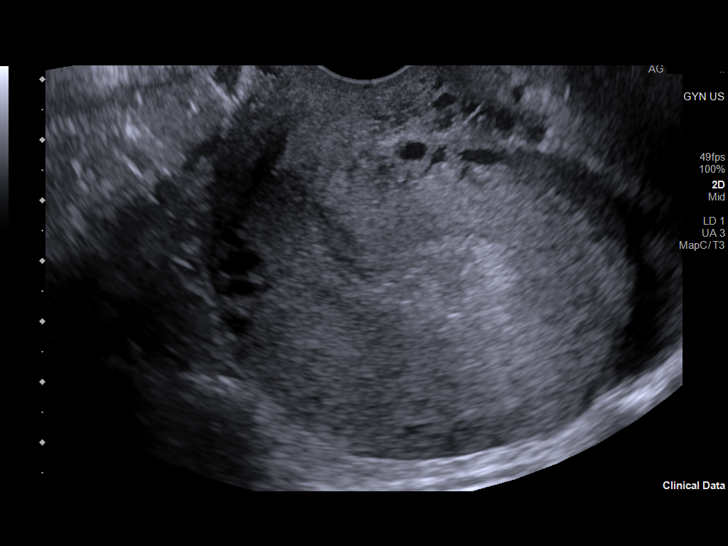
[im 68/102]
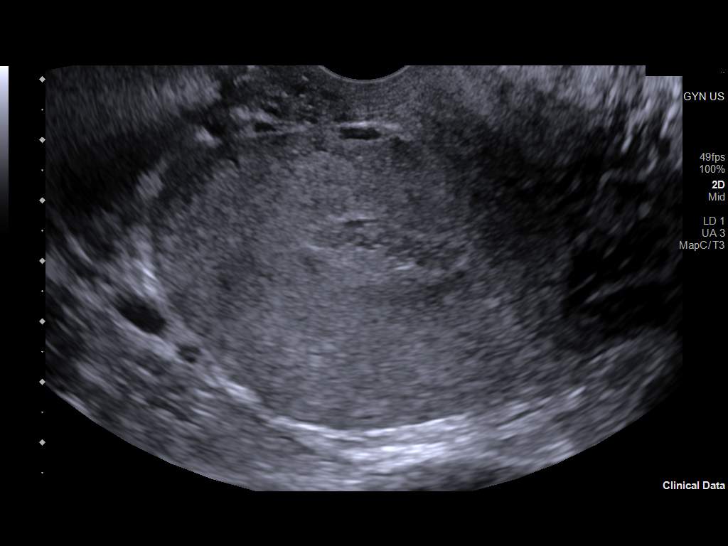
[im 76/102]
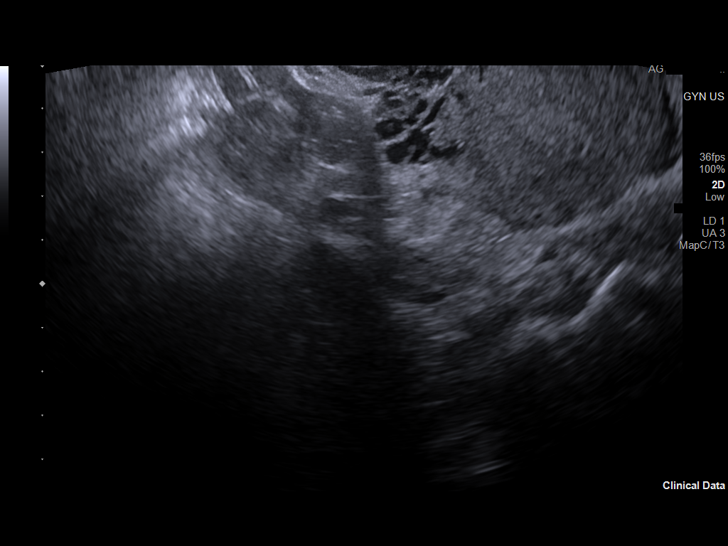
[im 85/102]
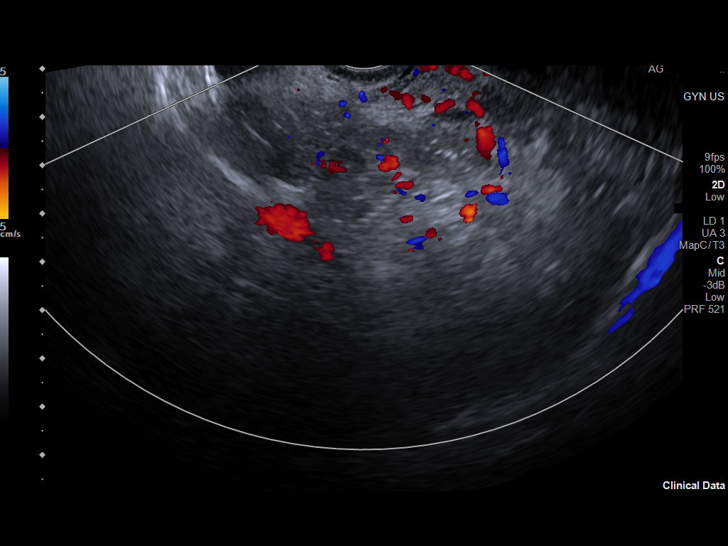
[im 93/102]
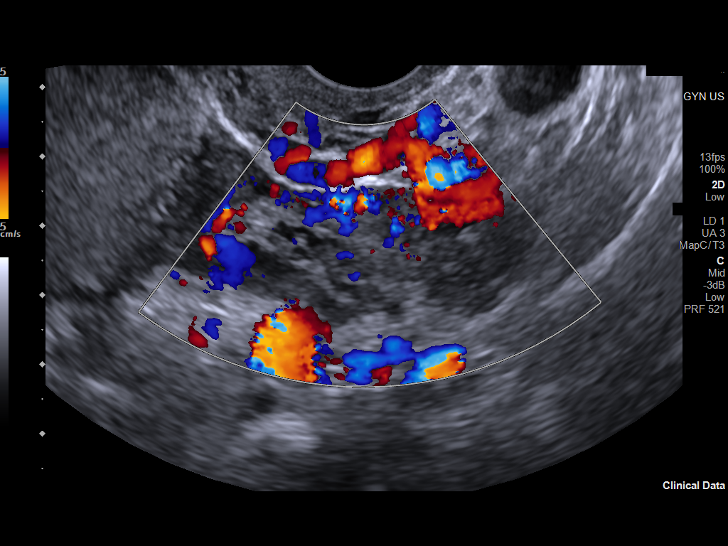
[im 102/102]
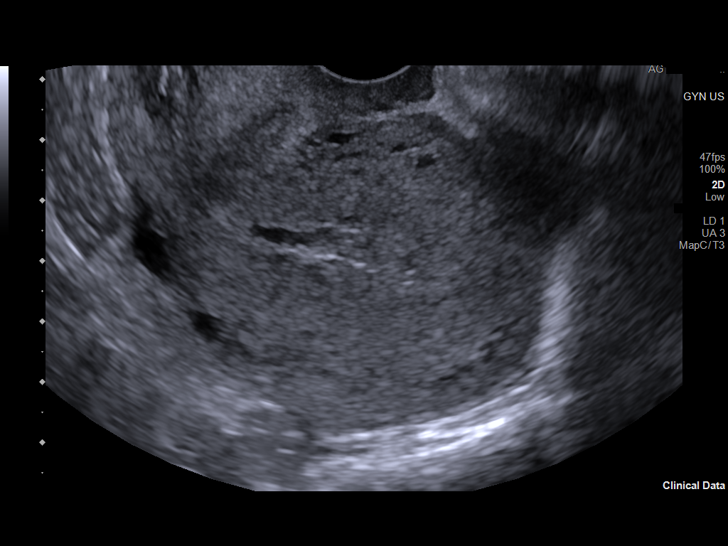

[13 of 25 positions shown; findings below may reference images not displayed]

FINDINGS: Uterus

Measurements: 7.8 x 5.3 x 6.7 cm = volume: 144.3 mL. No fibroids or
other mass visualized. Uterus is retroverted.

Endometrium

Thickness: 9 mm. There is inhomogeneous echogenicity in the
endometrium possibly blood products. There is no increase in
vascularity in the endometrium.

Right ovary

Not sonographically visualized.

Left ovary

Measurements: 3.1 x 1.2 x 1.8 cm = volume: 3.6 mL. Normal
appearance/no adnexal mass.

Other findings

Small amount of free fluid is seen in the pelvis. There are ectatic
vessels in both adnexal regions, possibly suggesting pelvic venous
congestion.
IMPRESSION: Uterus is retroverted. There is inhomogeneous echogenicity in the
endometrium possibly suggesting presence of blood products. There is
no abnormal increased vascularity in the endometrium.

Right ovary is not sonographically visualized. Small amount of free
fluid in the pelvis may suggest recent rupture of ovarian cyst or
follicle.

There are ectatic vessels in both adnexal regions suggesting
possible pelvic venous congestion.

## 2023-09-15 ENCOUNTER — Telehealth: Payer: BC Managed Care – PPO | Admitting: Family

## 2023-09-15 DIAGNOSIS — B3731 Acute candidiasis of vulva and vagina: Secondary | ICD-10-CM | POA: Diagnosis not present

## 2023-09-15 MED ORDER — FLUCONAZOLE 150 MG PO TABS
150.0000 mg | ORAL_TABLET | ORAL | 0 refills | Status: DC | PRN
Start: 2023-09-15 — End: 2024-03-26

## 2023-09-15 NOTE — Progress Notes (Signed)

## 2023-09-26 ENCOUNTER — Emergency Department (HOSPITAL_BASED_OUTPATIENT_CLINIC_OR_DEPARTMENT_OTHER)
Admission: EM | Admit: 2023-09-26 | Discharge: 2023-09-26 | Disposition: A | Discharge status: Home / Self Care | Attending: Emergency Medicine | Admitting: Emergency Medicine

## 2023-09-26 ENCOUNTER — Emergency Department (HOSPITAL_BASED_OUTPATIENT_CLINIC_OR_DEPARTMENT_OTHER)

## 2023-09-26 ENCOUNTER — Other Ambulatory Visit: Payer: Self-pay

## 2023-09-26 ENCOUNTER — Encounter (HOSPITAL_BASED_OUTPATIENT_CLINIC_OR_DEPARTMENT_OTHER): Payer: Self-pay | Admitting: Emergency Medicine

## 2023-09-26 DIAGNOSIS — W010XXA Fall on same level from slipping, tripping and stumbling without subsequent striking against object, initial encounter: Secondary | ICD-10-CM | POA: Diagnosis not present

## 2023-09-26 DIAGNOSIS — M25571 Pain in right ankle and joints of right foot: Secondary | ICD-10-CM | POA: Insufficient documentation

## 2023-09-26 DIAGNOSIS — G8911 Acute pain due to trauma: Secondary | ICD-10-CM | POA: Diagnosis not present

## 2023-09-26 DIAGNOSIS — M25561 Pain in right knee: Secondary | ICD-10-CM | POA: Insufficient documentation

## 2023-09-26 MED ORDER — OXYCODONE-ACETAMINOPHEN 5-325 MG PO TABS
1.0000 | ORAL_TABLET | ORAL | Status: DC | PRN
  Administered 2023-09-26: 1 via ORAL
  Filled 2023-09-26: qty 1

## 2023-09-26 NOTE — ED Triage Notes (Signed)
 Tripped up stairs and struck right knee. Right ankle also twisted under her. Difficulty walking- ambulatory to room- refuses wheelchair.

## 2023-09-26 NOTE — Discharge Instructions (Signed)
 You were evaluated in the emergency room for right ankle and knee pain following a fall.  Your x-rays did not show any acute abnormality.  I suspect you sustained a soft tissue injury such as contusion/sprain.  You are provided a referral to follow-up with orthopedics.  Please call make an appointment earliest convenience.  You may take Tylenol 1000 mg every 4-6 hours up to 3 times a day and/or Motrin 600 mg every 4-6 hours up to 3 times a day for pain and inflammation.

## 2023-09-26 NOTE — ED Provider Notes (Signed)
  EMERGENCY DEPARTMENT AT [facility] Provider Note   CSN: [id number] Arrival date & time: 09/26/23  4098     History  Chief Complaint  Patient presents with   Knee Pain   Tina Osborn is a 38 y.o. female with overall noncontributory past medical history presents with complaints of mechanical fall with right lower extremity pain.  Patient states she banged her knee on the steps and twisted her ankle.  Denies any numbness or tingling to her leg.  She is able to ambulate with some discomfort.  Denies any prior surgery to the extremity.   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 Authorizing Provider  cephALEXin (KEFLEX) 500 MG capsule Take 1 capsule (500 mg total) by mouth 2 (two) times daily. 05/22/23   Jannifer Rodney A, FNP  escitalopram (LEXAPRO) 10 MG tablet Take 10 mg by mouth daily. 08/17/21   [provider]  fluconazole (DIFLUCAN) 150 MG tablet Take 1 tablet (150 mg total) by mouth every three (3) days as needed. 09/15/23   Jannifer Rodney A, FNP  metoprolol succinate (TOPROL-XL) 25 MG 24 hr tablet Take 25 mg by mouth daily. 10/22/21   [provider]  pantoprazole (PROTONIX) 40 MG tablet Take 40 mg by mouth daily. 04/01/20   [provider]  Rimegepant Sulfate (NURTEC) 75 MG TBDP Take 1 tablet (75 mg total) by mouth every other day. 12/25/22 12/25/23  Antony Madura, MD  SUMAtriptan (IMITREX) 100 MG tablet Take 1 tablet (100 mg total) by mouth once as needed for up to 1 dose for migraine. May repeat in 2 hours if headache persists or recurs. 07/24/22   Antony Madura, MD      Allergies    Hydrocortisone    Review of Systems   Review of Systems  Musculoskeletal:  Positive for  myalgias.    Physical Exam Updated Vital Signs BP (!) 127/92   Pulse 75   Temp 98.2 F (36.8 C) (Oral)   Resp 16   LMP 10/08/2021 Comment: negative urine test on 01/26/22  SpO2 100%  Physical Exam Vitals and nursing note reviewed.  Constitutional:      General: She is not in acute distress.    Appearance: She is well-developed.  HENT:     Head: Normocephalic and atraumatic.  Eyes:     Conjunctiva/sclera: Conjunctivae normal.  Cardiovascular:     Pulses: Normal pulses.  Pulmonary:     Effort: Pulmonary effort is normal. No respiratory distress.  Musculoskeletal:        General: No swelling.     Cervical back: Neck supple.     Comments: Examination of right lower extremity demonstrates tenderness to the medial aspect of the knee and medial and lateral aspects of the ankle.  There is mild ecchymosis about the medial aspect of the knee.  No significant swelling appreciated.  No gross deformities.  DP pulses symmetric.  Tolerates full range of motion of knee and ankle.  5 out of 5 strength lower extremity.  NVI  Skin:    General: Skin is warm and dry.     Capillary Refill: Capillary refill takes less than 2 seconds.  Neurological:  Mental Status: She is alert.  Psychiatric:        Mood and Affect: Mood normal.     ED Results / Procedures / Treatments   Labs (all labs ordered are listed, but only abnormal results are displayed) Labs Reviewed - No data to display  EKG None  Radiology DG Knee Complete 4 Views Right Result Date: 09/26/2023 CLINICAL DATA:  Right knee pain after fall. EXAM: RIGHT KNEE - COMPLETE 4+ VIEW COMPARISON:  None Available. FINDINGS: No evidence of fracture, dislocation, or joint effusion. No evidence of arthropathy or other focal bone abnormality. Soft tissues are unremarkable. IMPRESSION: Negative. Electronically Signed   By: Lupita Raider M.D.   On: 09/26/2023 20:00   DG Ankle Complete Right Result Date: 09/26/2023 CLINICAL DATA:  Right ankle  pain after fall. EXAM: RIGHT ANKLE - COMPLETE 3+ VIEW COMPARISON:  None Available. FINDINGS: There is no evidence of fracture, dislocation, or joint effusion. There is no evidence of arthropathy or other focal bone abnormality. Soft tissues are unremarkable. IMPRESSION: Negative. Electronically Signed   By: Lupita Raider M.D.   On: 09/26/2023 19:58    Procedures Procedures    Medications Ordered in ED Medications  oxyCODONE-acetaminophen (PERCOCET/ROXICET) 5-325 MG per tablet 1 tablet (1 tablet Oral Given 09/26/23 2106)    ED Course/ Medical Decision Making/ A&P                                 Medical Decision Making Amount and/or Complexity of Data Reviewed Radiology: ordered.  Risk Prescription drug management.   This patient presents to the ED with chief complaint(s) of leg pain.  The complaint involves an extensive differential diagnosis and also carries with it a high risk of complications and morbidity.   pertinent past medical history as listed in HPI  The differential diagnosis includes  Fracture, dislocation, sprain The initial plan is to  Obtain plain films of the lower extremity Additional history obtained: Additional history obtained from spouse Records reviewed Care Everywhere/External Records  Initial Assessment:   Hemodynamically stable patient presenting following mechanical fall with right lower extremity pain.  On exam she has notable tenderness to the medial aspect of the knee and medial and lateral aspects of the ankle.  Tolerates full range of motion.  She can weight-bear with some discomfort.  There is no numbness or tingling of the lower extremity.  No gross deformities.  Compartments are soft.  No fractures or dislocations appreciated on x-ray.  Overall most suspicious for contusion/sprain.  She has a knee brace.  Will provide a ankle lace up and crutches.  Independent ECG interpretation:  none  Independent labs interpretation:  The following labs were  independently interpreted:  none  Independent visualization and interpretation of imaging: I independently visualized the following imaging with scope of interpretation limited to determining acute life threatening conditions related to emergency care: Ankle and knee x-rays, which revealed no acute abnormality  Treatment and Reassessment: Given Percocet following first assessment  Consultations obtained:   none  Disposition:   Patient will be discharged home.  Provided a ankle lace up.  She will continue wear her knee sleeve.  Crutches provided as well in addition to Ortho follow-up.  Social Determinants of Health:   none  This note was dictated with voice recognition software.  Despite best efforts at proofreading, errors may have occurred which can change the documentation meaning.  Final Clinical Impression(s) / ED Diagnoses Final diagnoses:  Acute pain of right knee  Acute right ankle pain    Rx / DC Orders ED Discharge Orders     None         Halford Decamp, PA-C 09/26/23 2155    Franne Forts, DO 09/27/23 0008

## 2023-10-22 ENCOUNTER — Ambulatory Visit

## 2023-10-27 ENCOUNTER — Ambulatory Visit: Attending: Sports Medicine

## 2023-10-27 DIAGNOSIS — M25561 Pain in right knee: Secondary | ICD-10-CM | POA: Insufficient documentation

## 2023-10-27 NOTE — Therapy (Addendum)
 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   Irregular heart rate    Medical history non-contributory    Past Surgical History:  Procedure Laterality Date   LAPAROSCOPIC ASSISTED VAGINAL HYSTERECTOMY N/A 01/28/2022   Procedure: LAPAROSCOPIC ASSISTED VAGINAL HYSTERECTOMY;  Surgeon: Myna Hidalgo, DO;  Location: [facility];  Service: Gynecology;  Laterality: N/A;   LAPAROSCOPIC BILATERAL SALPINGECTOMY Bilateral 01/28/2022   Procedure: LAPAROSCOPIC BILATERAL SALPINGECTOMY;  Surgeon: Myna Hidalgo, DO;  Location: [facility];  Service: Gynecology;  Laterality: Bilateral;   NO PAST SURGERIES     Patient Active Problem List   Diagnosis Date Noted   Ineffective breast feeding 04/25/2013   HSV-2 seropositive     PCP: Benita Stabile, MD  REFERRING PROVIDER: Melina Fiddler, MD  REFERRING DIAG: Right knee contusion/sprain   THERAPY DIAG:  Acute pain of right knee  Rationale for Evaluation and Treatment: Rehabilitation  ONSET DATE: March 9th, 2025  SUBJECTIVE:   SUBJECTIVE STATEMENT: Patient reported right knee pain that began on March 9th following a fall while ascending stairs at her home. Stated that pain is a 6/10 at its worst and 4/10 at time of initial evaluation. Pain impacts her ability to perform ADL's. It is most aggravated by walking and driving.   PERTINENT HISTORY: Hydrocortisone allergy  PAIN:  Are you having pain? Yes: NPRS scale: 6/10 at  its worst, 4/6 at time of treatment Pain location: Right Knee Pain description: Sore, Throbbing Aggravating factors: Walking, driving, repeated bending Relieving factors: rest and hot pack  PRECAUTIONS: None  RED FLAGS: None   WEIGHT BEARING RESTRICTIONS: No  FALLS:  Has patient fallen in last 6 months? Yes. Number of falls 1  LIVING ENVIRONMENT: Lives with: lives with their family Lives in: House/apartment Stairs:  Yes: Internal: Front Porch 5 steps bilateral handrails, Garage 2 steps no handrails Has following equipment at home: Crutches  OCCUPATION: Advertising account planner  PLOF: Independent  PATIENT GOALS: Walk long distances, drive long distances, decrease pain, less difficulty with work activities.   NEXT MD VISIT: April 23rd, 2025  OBJECTIVE:  Note: Objective measures were completed at Evaluation unless otherwise noted.  DIAGNOSTIC FINDINGS:   Right knee X-Ray 09/26/2023 20:00: Negative   PATIENT SURVEYS:  LEFS 43/80  COGNITION: Overall cognitive status:  WFL      SENSATION: WFL   POSTURE: No Significant postural limitations  PALPATION: TTP of the right knee's medial joint line and pes anserine region  PASSIVE ACCESSORY MOTION  Right knee eval:  Tibiofemoral AP: Pain and stiffness Tibiofemoral PA: Pain and stiffness Patella: Pain with inferior and lateral patellar mobility   LOWER EXTREMITY ROM:  Active ROM Right eval Left eval  Hip flexion    Hip extension    Hip abduction    Hip adduction    Hip internal rotation  Hip external rotation    Knee flexion 126 degrees pain and tightness with end range OP   PROM 140 degrees pain at end range 135 degrees  Knee extension -2 degrees pain with end range OP   PROM: -3 degrees pain at end range -3 degrees  Ankle dorsiflexion    Ankle plantarflexion    Ankle inversion                 (Blank rows = not tested)    LOWER EXTREMITY MMT:  MMT Right eval Left eval  Hip flexion    Hip  extension    Hip abduction    Hip adduction    Hip internal rotation    Hip external rotation    Knee flexion 3+ 4+   Knee extension 3+ 4+   Ankle dorsiflexion    Ankle plantarflexion    Ankle inversion    Ankle eversion     (Blank rows = not tested)  LOWER EXTREMITY SPECIAL TESTS:  Knee special tests: Anterior drawer test: positive , McMurray's test: negative, and Varus test: negative and Valgus test: positive   GAIT: Distance walked: 82 feet Assistive device utilized: None Level of assistance: Complete Independence Comments: The patient demonstrated a compensatory gait pattern, favoring the left lower extremity during ambulation.   PATIENT EDUCATION:  Education details: Patient educated on objective findings, POC, prognosis and goals for therapy.  Person educated: Patient Education method: Explanation Education comprehension: verbalized understanding  HOME EXERCISE PROGRAM:   ASSESSMENT:  CLINICAL IMPRESSION: Patient is a 38 y.o. female who was seen today for physical therapy evaluation and treatment for right knee pain. Objective findings from evaluation of right knee include compensatory gait, positive valgus test, positive anterior drawer test, weakness and pain with knee flexion and extension, TTP of medial joint line and pes anserine region, as well as pain with AROM end range knee flexion and extension. Additionally, pain was elicited with tibiofemoral AP and PA passive accessory mobility as well as pain with inferior and lateral patellar mobility. Patient would benefit from skilled physical therapy to address impairments and enhance ability to ambulate and drive.   OBJECTIVE IMPAIRMENTS: decreased activity tolerance, decreased balance, decreased coordination, decreased endurance, decreased knowledge of condition, decreased mobility, difficulty walking, decreased ROM, decreased strength, hypomobility, increased edema, impaired flexibility, improper body mechanics, and  pain.   ACTIVITY LIMITATIONS: carrying, lifting, bending, sitting, standing, squatting, stairs, and transfers  PARTICIPATION LIMITATIONS: cleaning, laundry, driving, shopping, community activity, occupation, and yard work  PERSONAL FACTORS:  No external factors  are affecting patient's functional outcome.   REHAB POTENTIAL: Good  CLINICAL DECISION MAKING: Stable/uncomplicated  EVALUATION COMPLEXITY: Low   GOALS: Goals reviewed with patient? No  SHORT TERM GOALS: Target date: 11/19/2023 Patient will be independent with her HEP.  Baseline: Goal status: INITIAL  2.  Patient's pain at its worst will decrease from 6/10 to 4/10 which will enable her to drive for longer periods of time.  Baseline:  Goal status: INITIAL  3.  Patient's knee flexion and extension MMT will increase to 4+ in order to help with walking.  Baseline:  Goal status: INITIAL  4.  The patients LEFS score will increase from 43 to 52 to ensure greater confidence in performing ADL's.   Baseline:  Goal status: INITIAL   LONG TERM GOALS: Target date: 12/10/2023  The patients LEFS score will increase from 52 to 64, which will ensure greater confidence in performing ADL's.  Baseline:  Goal status: INITIAL  2.  Patient will be independent with advanced HEP.  Baseline:  Goal status: INITIAL  3.  Patient's right knee flexion ROM will increase to 135, which will improve her ability to ascend and descend stairs.  Baseline:  Goal status: INITIAL  4.  Patient's pain at its worst will decrease from 4/10 to 2/10 which will ensure greater confidence to perform work activities.  Baseline:  Goal status: INITIAL  5.  Patient will report being able to drive at least 45 minutes to return to work.  Baseline:  Goal status: INITIAL   PLAN:  PT FREQUENCY: 2x/week  PT DURATION: 6 weeks  PLANNED INTERVENTIONS: 97164- PT Re-evaluation, 97110-Therapeutic exercises, 97530- Therapeutic activity, 97112- Neuromuscular  re-education, 97535- Self Care, 16109- Manual therapy, [phone]- Gait training, Patient/Family education, Balance training, Stair training, Taping, and Joint mobilization  PLAN FOR NEXT SESSION: Nustep, AAROM for knee flexion, hip and knee muscle isometrics, joint mobilizations, modalities as needed.    Jacalynn Buzzell, Student-PT 10/27/2023, 5:18 PM

## 2023-10-27 NOTE — Addendum Note (Signed)
 Addended by: Granville Lewis on: 10/27/2023 05:28 PM   Modules accepted: Orders

## 2023-11-01 ENCOUNTER — Ambulatory Visit

## 2023-11-01 DIAGNOSIS — M25561 Pain in right knee: Secondary | ICD-10-CM | POA: Diagnosis present

## 2023-11-01 NOTE — Therapy (Signed)
 OUTPATIENT PHYSICAL THERAPY LOWER EXTREMITY TREATMENT   Patient Name: Coletta Lockner MRN: [id number] DOB:Jul 14, 1986, 38 y.o., female Today's Date: 11/01/2023  END OF SESSION:  PT End of Session - 11/01/23 0803     Visit Number 2    Number of Visits 12    Date for PT Re-Evaluation 01/14/24    PT Start Time 0800    PT Stop Time 0845    PT Time Calculation (min) 45 min    Activity Tolerance Patient tolerated treatment well    Behavior During Therapy [facility] for tasks assessed/performed             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   Irregular heart rate    Medical history non-contributory    Past Surgical History:  Procedure Laterality Date   LAPAROSCOPIC ASSISTED VAGINAL HYSTERECTOMY N/A 01/28/2022   Procedure: LAPAROSCOPIC ASSISTED VAGINAL HYSTERECTOMY;  Surgeon: Ozan, Jennifer, DO;  Location: [facility];  Service: Gynecology;  Laterality: N/A;   LAPAROSCOPIC BILATERAL SALPINGECTOMY Bilateral 01/28/2022   Procedure: LAPAROSCOPIC BILATERAL SALPINGECTOMY;  Surgeon: Ozan, Jennifer, DO;  Location: [facility];  Service: Gynecology;  Laterality: Bilateral;   NO PAST SURGERIES     Patient Active Problem List   Diagnosis Date Noted   Ineffective breast feeding 04/25/2013   HSV-2 seropositive     PCP: Omie Bickers, MD  REFERRING PROVIDER: Shermon Divine, MD  REFERRING DIAG: Right knee contusion/sprain   THERAPY DIAG:  Acute pain of right knee  Rationale for Evaluation and Treatment: Rehabilitation  ONSET DATE: March 9th, 2025  SUBJECTIVE:   SUBJECTIVE STATEMENT:  Patient reported some slight pain and swelling due to minor right knee injury experienced over the weekend as she was hit by a roller skate while helping her son. Her pain is 3/10 at start of treatment.  PERTINENT HISTORY: Hydrocortisone allergy  PAIN:  Are you having pain? Yes: NPRS scale: 3/10 Pain location: Right  Knee Pain description: Sore, Throbbing Aggravating factors: Walking, driving, repeated bending Relieving factors: rest and hot pack  PRECAUTIONS: None  RED FLAGS: None   WEIGHT BEARING RESTRICTIONS: No  FALLS:  Has patient fallen in last 6 months? Yes. Number of falls 1  LIVING ENVIRONMENT: Lives with: lives with their family Lives in: House/apartment Stairs:  Yes: Internal: Front Porch 5 steps bilateral handrails, Garage 2 steps no handrails Has following equipment at home: Crutches  OCCUPATION: Advertising account planner  PLOF: Independent  PATIENT GOALS: Walk long distances, drive long distances, decrease pain, less difficulty with work activities.   NEXT MD VISIT: April 23rd, 2025  OBJECTIVE:  Note: Objective measures were completed at Evaluation unless otherwise noted.  DIAGNOSTIC FINDINGS:   Right knee X-Ray 09/26/2023 20:00: Negative   PATIENT SURVEYS:  LEFS 43/80  COGNITION: Overall cognitive status:  WFL      SENSATION: WFL  POSTURE: No Significant postural limitations  PALPATION: TTP of the right knee's medial joint line and pes anserine region  PASSIVE ACCESSORY MOTION  Right knee eval:  Tibiofemoral AP: Pain and stiffness Tibiofemoral PA: Pain and stiffness Patella: Pain with inferior and lateral patellar mobility   LOWER EXTREMITY ROM:  Active ROM Right eval Left eval  Hip flexion    Hip extension    Hip abduction    Hip adduction    Hip internal rotation    Hip external rotation    Knee flexion 126 degrees pain and tightness  with end range OP   PROM 140 degrees pain at end range 135 degrees  Knee extension -2 degrees pain with end range OP   PROM: -3 degrees pain at end range -3 degrees  Ankle dorsiflexion    Ankle plantarflexion    Ankle inversion                 (Blank rows = not tested)    LOWER EXTREMITY MMT:  MMT Right eval Left eval  Hip flexion    Hip extension    Hip abduction    Hip adduction    Hip internal  rotation    Hip external rotation    Knee flexion 3+ 4+   Knee extension 3+ 4+   Ankle dorsiflexion    Ankle plantarflexion    Ankle inversion    Ankle eversion     (Blank rows = not tested)  LOWER EXTREMITY SPECIAL TESTS:  Knee special tests: Anterior drawer test: positive , McMurray's test: negative, and Varus test: negative and Valgus test: positive   GAIT: Distance walked: 82 feet Assistive device utilized: None Level of assistance: Complete Independence Comments: The patient demonstrated a compensatory gait pattern, favoring the left lower extremity during ambulation.   TREATMENT DATE:                                   11/01/23 EXERCISE LOG   Exercise Repetitions and Resistance Comments  Nustep   Level 1 15 minutes  TE  AAROM Knee Flexion   3 sets of 1 minute  TA  Seated Hip Abduction   3 sets of 12 reps   TE  Seated March   2 sets of 10 reps 5 sec iso holds  NR  Standing Heel and Toe Raises  3 sets of 8 reps TA  Standing Knee Flexion   3 sets of 8 reps  NR  SAQ  3 sets of 8 reps  NR   Blank cell = exercise not performed today   PATIENT EDUCATION:  Education details: Patient educated on objective findings, POC, prognosis and goals for therapy.  Person educated: Patient Education method: Explanation Education comprehension: verbalized understanding  HOME EXERCISE PROGRAM: [id number]   ASSESSMENT:  CLINICAL IMPRESSION:  The patient responded well to treatment. She was provided verbal and tactile cueing for SAQ, standing knee flexion, seated march, and seated hip abduction exercises to ensure proper muscle engagement and body mechanics. She was provided her HEP and both demonstrated and verbalized compliance. She reported slight fatigue and stiffness at the end of the treatment session. Patient will continue to benefit from skilled PT to address impairments and decrease pain while improving ability to perform ADL's.    OBJECTIVE IMPAIRMENTS: decreased activity  tolerance, decreased balance, decreased coordination, decreased endurance, decreased knowledge of condition, decreased mobility, difficulty walking, decreased ROM, decreased strength, hypomobility, increased edema, impaired flexibility, improper body mechanics, and pain.   ACTIVITY LIMITATIONS: carrying, lifting, bending, sitting, standing, squatting, stairs, and transfers  PARTICIPATION LIMITATIONS: cleaning, laundry, driving, shopping, community activity, occupation, and yard work  PERSONAL FACTORS:  No external factors  are affecting patient's functional outcome.   REHAB POTENTIAL: Good  CLINICAL DECISION MAKING: Stable/uncomplicated  EVALUATION COMPLEXITY: Low   GOALS: Goals reviewed with patient? No  SHORT TERM GOALS: Target date: 11/19/2023 Patient will be independent with her HEP.  Baseline: Goal status: INITIAL  2.  Patient's pain at its worst will  decrease from 6/10 to 4/10 which will enable her to drive for longer periods of time.  Baseline:  Goal status: INITIAL  3.  Patient's knee flexion and extension MMT will increase to 4+ in order to help with walking.  Baseline:  Goal status: INITIAL  4.  The patients LEFS score will increase from 43 to 52 to ensure greater confidence in performing ADL's.   Baseline:  Goal status: INITIAL   LONG TERM GOALS: Target date: 12/10/2023  The patients LEFS score will increase from 52 to 64, which will ensure greater confidence in performing ADL's.  Baseline:  Goal status: INITIAL  2.  Patient will be independent with advanced HEP.  Baseline:  Goal status: INITIAL  3.  Patient's right knee flexion ROM will increase to 135, which will improve her ability to ascend and descend stairs.  Baseline:  Goal status: INITIAL  4.  Patient's pain at its worst will decrease from 4/10 to 2/10 which will ensure greater confidence to perform work activities.  Baseline:  Goal status: INITIAL  5.  Patient will report being able to drive  at least 45 minutes to return to work.  Baseline:  Goal status: INITIAL   PLAN:  PT FREQUENCY: 2x/week  PT DURATION: 6 weeks  PLANNED INTERVENTIONS: 97164- PT Re-evaluation, 97110-Therapeutic exercises, 97530- Therapeutic activity, 97112- Neuromuscular re-education, 97535- Self Care, 32440- Manual therapy, [phone]- Gait training, Patient/Family education, Balance training, Stair training, Taping, and Joint mobilization  PLAN FOR NEXT SESSION: Nustep, AAROM for knee flexion and extension, concentric hip, knee and lower leg muscle strengthening exercises, joint mobilizations, modalities as needed.    Areli Jowett, Student-PT 11/01/2023, 9:28 AM

## 2023-11-08 ENCOUNTER — Ambulatory Visit

## 2023-11-08 DIAGNOSIS — M25561 Pain in right knee: Secondary | ICD-10-CM

## 2023-11-08 NOTE — Therapy (Signed)
 OUTPATIENT PHYSICAL THERAPY LOWER EXTREMITY TREATMENT   Patient Name: Amerah Puleo MRN: [id number] DOB:June 29, 1986, 38 y.o., female Today's Date: 11/08/2023  END OF SESSION:  PT End of Session - 11/08/23 0849     Visit Number 3    Number of Visits 12    Date for PT Re-Evaluation 01/14/24    PT Start Time 0845    PT Stop Time 0930    PT Time Calculation (min) 45 min    Activity Tolerance Patient tolerated treatment well    Behavior During Therapy [facility] for tasks assessed/performed             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   Irregular heart rate    Medical history non-contributory    Past Surgical History:  Procedure Laterality Date   LAPAROSCOPIC ASSISTED VAGINAL HYSTERECTOMY N/A 01/28/2022   Procedure: LAPAROSCOPIC ASSISTED VAGINAL HYSTERECTOMY;  Surgeon: Ozan, Jennifer, DO;  Location: [facility];  Service: Gynecology;  Laterality: N/A;   LAPAROSCOPIC BILATERAL SALPINGECTOMY Bilateral 01/28/2022   Procedure: LAPAROSCOPIC BILATERAL SALPINGECTOMY;  Surgeon: Ozan, Jennifer, DO;  Location: [facility];  Service: Gynecology;  Laterality: Bilateral;   NO PAST SURGERIES     Patient Active Problem List   Diagnosis Date Noted   Ineffective breast feeding 04/25/2013   HSV-2 seropositive     PCP: Omie Bickers, MD  REFERRING PROVIDER: Shermon Divine, MD  REFERRING DIAG: Right knee contusion/sprain   THERAPY DIAG:  Acute pain of right knee  Rationale for Evaluation and Treatment: Rehabilitation  ONSET DATE: March 9th, 2025  SUBJECTIVE:   SUBJECTIVE STATEMENT:  Patient her right knee is feeling much better today. She has been consistently performing her HEP. She also reported that she has been comfortably performing HEP and ADL's without her knee brace. Pain was 0/10 at start of treatment.   PERTINENT HISTORY: Hydrocortisone  allergy  PAIN:  Are you having pain? Yes: NPRS  scale: 0/10 Pain location: Right Knee Pain description: Sore, Throbbing Aggravating factors: Walking, driving, repeated bending Relieving factors: rest and hot pack  PRECAUTIONS: None  RED FLAGS: None   WEIGHT BEARING RESTRICTIONS: No  FALLS:  Has patient fallen in last 6 months? Yes. Number of falls 1  LIVING ENVIRONMENT: Lives with: lives with their family Lives in: House/apartment Stairs:  Yes: Internal: Front Porch 5 steps bilateral handrails, Garage 2 steps no handrails Has following equipment at home: Crutches  OCCUPATION: Advertising account planner  PLOF: Independent  PATIENT GOALS: Walk long distances, drive long distances, decrease pain, less difficulty with work activities.   NEXT MD VISIT: April 23rd, 2025  OBJECTIVE:  Note: Objective measures were completed at Evaluation unless otherwise noted.  DIAGNOSTIC FINDINGS:   Right knee X-Ray 09/26/2023 20:00: Negative   PATIENT SURVEYS:  LEFS 43/80  COGNITION: Overall cognitive status:  WFL      SENSATION: WFL  POSTURE: No Significant postural limitations  PALPATION: TTP of the right knee's medial joint line and pes anserine region  PASSIVE ACCESSORY MOTION  Right knee eval:  Tibiofemoral AP: Pain and stiffness Tibiofemoral PA: Pain and stiffness Patella: Pain with inferior and lateral patellar mobility   LOWER EXTREMITY ROM:  Active ROM Right eval Left eval  Hip flexion    Hip extension    Hip abduction    Hip adduction    Hip internal rotation    Hip external rotation    Knee flexion 126 degrees  pain and tightness with end range OP   PROM 140 degrees pain at end range 135 degrees  Knee extension -2 degrees pain with end range OP   PROM: -3 degrees pain at end range -3 degrees  Ankle dorsiflexion    Ankle plantarflexion    Ankle inversion                 (Blank rows = not tested)    LOWER EXTREMITY MMT:  MMT Right eval Left eval  Hip flexion    Hip extension    Hip abduction     Hip adduction    Hip internal rotation    Hip external rotation    Knee flexion 3+ 4+   Knee extension 3+ 4+   Ankle dorsiflexion    Ankle plantarflexion    Ankle inversion    Ankle eversion     (Blank rows = not tested)  LOWER EXTREMITY SPECIAL TESTS:  Knee special tests: Anterior drawer test: positive , McMurray's test: negative, and Varus test: negative and Valgus test: positive   GAIT: Distance walked: 82 feet Assistive device utilized: None Level of assistance: Complete Independence Comments: The patient demonstrated a compensatory gait pattern, favoring the left lower extremity during ambulation.   TREATMENT DATE:                                     11/08/23 EXERCISE LOG   Exercise Repetitions and Resistance Comments  Nustep  Level 4 17 minutes  TE  Standing Lunges   14" 3 minutes  TE  Standing Knee Flexion  3 sets of 8 reps  NE both sides  Standing Hip Abduction   2 sets of 12 reps   NE both sides   Standing marches   3 sets of 12 reps   NE       Blank cell = exercise not performed today                                    11/01/23 EXERCISE LOG   Exercise Repetitions and Resistance Comments  Nustep   Level 1 15 minutes  TE  AAROM Knee Flexion   3 sets of 1 minute  TA  Seated Hip Abduction   3 sets of 12 reps   TE  Seated March   2 sets of 10 reps 5 sec iso holds  NR  Standing Heel and Toe Raises  3 sets of 8 reps TA  Standing Knee Flexion   3 sets of 8 reps  NR  SAQ  3 sets of 8 reps  NR   Blank cell = exercise not performed today   PATIENT EDUCATION:  Education details: Patient educated on objective findings, POC, prognosis and goals for therapy.  Person educated: Patient Education method: Explanation Education comprehension: verbalized understanding  HOME EXERCISE PROGRAM: [id number]   ASSESSMENT:  CLINICAL IMPRESSION:  The patient responded well to treatment. She was provided verbal and tactile cueing for standing march, knee flexion, and abduction  to ensure proper muscle engagement and body mechanics. She reported slight fatigue and stiffness at the end of the treatment session. Patient will continue to benefit from skilled PT to address impairments and decrease pain while improving ability to perform ADL's.    OBJECTIVE IMPAIRMENTS: decreased activity tolerance, decreased balance, decreased coordination, decreased endurance, decreased  knowledge of condition, decreased mobility, difficulty walking, decreased ROM, decreased strength, hypomobility, increased edema, impaired flexibility, improper body mechanics, and pain.   ACTIVITY LIMITATIONS: carrying, lifting, bending, sitting, standing, squatting, stairs, and transfers  PARTICIPATION LIMITATIONS: cleaning, laundry, driving, shopping, community activity, occupation, and yard work  PERSONAL FACTORS:  No external factors  are affecting patient's functional outcome.   REHAB POTENTIAL: Good  CLINICAL DECISION MAKING: Stable/uncomplicated  EVALUATION COMPLEXITY: Low   GOALS: Goals reviewed with patient? No  SHORT TERM GOALS: Target date: 11/19/2023 Patient will be independent with her HEP.  Baseline: Goal status: INITIAL  2.  Patient's pain at its worst will decrease from 6/10 to 4/10 which will enable her to drive for longer periods of time.  Baseline:  Goal status: INITIAL  3.  Patient's knee flexion and extension MMT will increase to 4+ in order to help with walking.  Baseline:  Goal status: INITIAL  4.  The patients LEFS score will increase from 43 to 52 to ensure greater confidence in performing ADL's.   Baseline:  Goal status: INITIAL   LONG TERM GOALS: Target date: 12/10/2023  The patients LEFS score will increase from 52 to 64, which will ensure greater confidence in performing ADL's.  Baseline:  Goal status: INITIAL  2.  Patient will be independent with advanced HEP.  Baseline:  Goal status: INITIAL  3.  Patient's right knee flexion ROM will increase to  135, which will improve her ability to ascend and descend stairs.  Baseline:  Goal status: INITIAL  4.  Patient's pain at its worst will decrease from 4/10 to 2/10 which will ensure greater confidence to perform work activities.  Baseline:  Goal status: INITIAL  5.  Patient will report being able to drive at least 45 minutes to return to work.  Baseline:  Goal status: INITIAL   PLAN:  PT FREQUENCY: 2x/week  PT DURATION: 6 weeks  PLANNED INTERVENTIONS: 97164- PT Re-evaluation, 97110-Therapeutic exercises, 97530- Therapeutic activity, 97112- Neuromuscular re-education, 97535- Self Care, [id number]- Manual therapy, [phone]- Gait training, Patient/Family education, Balance training, Stair training, Taping, and Joint mobilization  PLAN FOR NEXT SESSION: Nustep, AAROM for knee flexion and extension, continue concentric hip, knee and lower leg muscle strengthening exercises, balance exercises, joint mobilizations, modalities as needed.    Hollie Bartus, Student-PT 11/08/2023, 10:46 AM

## 2023-11-10 ENCOUNTER — Ambulatory Visit

## 2023-11-10 DIAGNOSIS — M25561 Pain in right knee: Secondary | ICD-10-CM | POA: Diagnosis not present

## 2023-11-10 NOTE — Therapy (Signed)
 OUTPATIENT PHYSICAL THERAPY LOWER EXTREMITY TREATMENT   Patient Name: Tina Osborn MRN: [id number] DOB:[birth year], 38 y.o., female Today's Date: 11/10/2023  END OF SESSION:  PT End of Session - 11/10/23 0851     Visit Number 4    Number of Visits 12    Date for PT Re-Evaluation 01/14/24    PT Start Time 0845    PT Stop Time 0930    PT Time Calculation (min) 45 min    Activity Tolerance Patient tolerated treatment well    Behavior During Therapy [facility] for tasks assessed/performed             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   Irregular heart rate    Medical history non-contributory    Past Surgical History:  Procedure Laterality Date   LAPAROSCOPIC ASSISTED VAGINAL HYSTERECTOMY N/A 01/28/2022   Procedure: LAPAROSCOPIC ASSISTED VAGINAL HYSTERECTOMY;  Surgeon: Ozan, Jennifer, DO;  Location: [facility];  Service: Gynecology;  Laterality: N/A;   LAPAROSCOPIC BILATERAL SALPINGECTOMY Bilateral 01/28/2022   Procedure: LAPAROSCOPIC BILATERAL SALPINGECTOMY;  Surgeon: Ozan, Jennifer, DO;  Location: [facility];  Service: Gynecology;  Laterality: Bilateral;   NO PAST SURGERIES     Patient Active Problem List   Diagnosis Date Noted   Ineffective breast feeding 04/25/2013   HSV-2 seropositive     PCP: Omie Bickers, MD  REFERRING PROVIDER: Shermon Divine, MD  REFERRING DIAG: Right knee contusion/sprain   THERAPY DIAG:  Acute pain of right knee  Rationale for Evaluation and Treatment: Rehabilitation  ONSET DATE: March 9th, 2025  SUBJECTIVE:   SUBJECTIVE STATEMENT:  Patient reported some right knee stiffness today. Stated that she has been walking without her brace walking from her living room to the kitchen with greater ease. Reported 0/10 pain at the start of treatment session.    PERTINENT HISTORY: Hydrocortisone  allergy  PAIN:  Are you having pain? Yes: NPRS scale: 0/10 Pain  location: Right Knee Pain description: Sore, Throbbing Aggravating factors: Walking, driving, repeated bending Relieving factors: rest and hot pack  PRECAUTIONS: None  RED FLAGS: None   WEIGHT BEARING RESTRICTIONS: No  FALLS:  Has patient fallen in last 6 months? Yes. Number of falls 1  LIVING ENVIRONMENT: Lives with: lives with their family Lives in: House/apartment Stairs:  Yes: Internal: Front Porch 5 steps bilateral handrails, Garage 2 steps no handrails Has following equipment at home: Crutches  OCCUPATION: Advertising account planner  PLOF: Independent  PATIENT GOALS: Walk long distances, drive long distances, decrease pain, less difficulty with work activities.   NEXT MD VISIT: April 23rd, 2025  OBJECTIVE:  Note: Objective measures were completed at Evaluation unless otherwise noted.  DIAGNOSTIC FINDINGS:   Right knee X-Ray 09/26/2023 20:00: Negative   PATIENT SURVEYS:  LEFS 43/80  COGNITION: Overall cognitive status:  WFL      SENSATION: WFL  POSTURE: No Significant postural limitations  PALPATION: TTP of the right knee's medial joint line and pes anserine region  PASSIVE ACCESSORY MOTION  Right knee eval:  Tibiofemoral AP: Pain and stiffness Tibiofemoral PA: Pain and stiffness Patella: Pain with inferior and lateral patellar mobility   LOWER EXTREMITY ROM:  Active ROM Right eval Left eval  Hip flexion    Hip extension    Hip abduction    Hip adduction    Hip internal rotation    Hip external rotation    Knee flexion 126 degrees pain and  tightness with end range OP   PROM 140 degrees pain at end range 135 degrees  Knee extension -2 degrees pain with end range OP   PROM: -3 degrees pain at end range -3 degrees  Ankle dorsiflexion    Ankle plantarflexion    Ankle inversion                 (Blank rows = not tested)    LOWER EXTREMITY MMT:  MMT Right eval Left eval  Hip flexion    Hip extension    Hip abduction    Hip adduction     Hip internal rotation    Hip external rotation    Knee flexion 3+ 4+   Knee extension 3+ 4+   Ankle dorsiflexion    Ankle plantarflexion    Ankle inversion    Ankle eversion     (Blank rows = not tested)  LOWER EXTREMITY SPECIAL TESTS:  Knee special tests: Anterior drawer test: positive , McMurray's test: negative, and Varus test: negative and Valgus test: positive   GAIT: Distance walked: 82 feet Assistive device utilized: None Level of assistance: Complete Independence Comments: The patient demonstrated a compensatory gait pattern, favoring the left lower extremity during ambulation.   TREATMENT DATE:                                     11/10/23 EXERCISE LOG   Exercise Repetitions and Resistance Comments  Nustep   Level 4 15 minutes   TE  Standing lunges   14" 3 minutes   TE  Standing marches on airex   3 sets of 12 reps  NR no parallel bar support  Standing heel/toe raises   3 sets of 12 reps   NR no parallel bar support  LAQ   2 sets of 12 reps 3#  NR   Standing knee flexion   2 sets of 12 reps 3#  NR       Blank cell = exercise not performed today   Manual Therapy   Joint Mobilizations: Right knee , Grade 3 tibiofemoral distraction+flexion to increase mobility.     Upon completion of manual therapy patient reported no adverse effects and increased mobility.                                     11/08/23 EXERCISE LOG   Exercise Repetitions and Resistance Comments  Nustep  Level 4 17 minutes  TE  Standing Lunges   14" 3 minutes  TE  Standing Knee Flexion  3 sets of 8 reps  NE both sides  Standing Hip Abduction   2 sets of 12 reps   NE both sides   Standing marches   3 sets of 12 reps   NE       Blank cell = exercise not performed today                                    11/01/23 EXERCISE LOG   Exercise Repetitions and Resistance Comments  Nustep   Level 1 15 minutes  TE  AAROM Knee Flexion   3 sets of 1 minute  TA  Seated Hip Abduction   3 sets of 12 reps    TE  Seated March   2 sets of 10 reps 5 sec iso holds  NR  Standing Heel and Toe Raises  3 sets of 8 reps TA  Standing Knee Flexion   3 sets of 8 reps  NR  SAQ  3 sets of 8 reps  NR   Blank cell = exercise not performed today   PATIENT EDUCATION:  Education details: Patient educated on objective findings, POC, prognosis and goals for therapy.  Person educated: Patient Education method: Explanation Education comprehension: verbalized understanding  HOME EXERCISE PROGRAM: [id number]   ASSESSMENT:  CLINICAL IMPRESSION:  The patient responded well to treatment. She was provided verbal cueing for knee flexion, LAQ, standing march, and toe/heel raise exercises to ensure proper muscle engagement and body mechanics. She was provided joint mobilizations and upon completion reported no adverse effects and increased mobility. She reported no pain, slight soreness and fatigue at the end of the treatment session. Patient will continue to benefit from skilled PT to address impairments and decrease pain while improving ability to perform ADL's.    OBJECTIVE IMPAIRMENTS: decreased activity tolerance, decreased balance, decreased coordination, decreased endurance, decreased knowledge of condition, decreased mobility, difficulty walking, decreased ROM, decreased strength, hypomobility, increased edema, impaired flexibility, improper body mechanics, and pain.   ACTIVITY LIMITATIONS: carrying, lifting, bending, sitting, standing, squatting, stairs, and transfers  PARTICIPATION LIMITATIONS: cleaning, laundry, driving, shopping, community activity, occupation, and yard work  PERSONAL FACTORS:  No external factors  are affecting patient's functional outcome.   REHAB POTENTIAL: Good  CLINICAL DECISION MAKING: Stable/uncomplicated  EVALUATION COMPLEXITY: Low   GOALS: Goals reviewed with patient? No  SHORT TERM GOALS: Target date: 11/19/2023 Patient will be independent with her HEP.  Baseline: Goal  status: MET   2.  Patient's pain at its worst will decrease from 6/10 to 4/10 which will enable her to drive for longer periods of time.  Baseline:  Goal status: INITIAL  3.  Patient's knee flexion and extension MMT will increase to 4+ in order to help with walking.  Baseline:  Goal status: INITIAL  4.  The patients LEFS score will increase from 43 to 52 to ensure greater confidence in performing ADL's.   Baseline:  Goal status: INITIAL   LONG TERM GOALS: Target date: 12/10/2023  The patients LEFS score will increase from 52 to 64, which will ensure greater confidence in performing ADL's.  Baseline:  Goal status: INITIAL  2.  Patient will be independent with advanced HEP.  Baseline:  Goal status: INITIAL  3.  Patient's right knee flexion ROM will increase to 135, which will improve her ability to ascend and descend stairs.  Baseline:  Goal status: INITIAL  4.  Patient's pain at its worst will decrease from 4/10 to 2/10 which will ensure greater confidence to perform work activities.  Baseline:  Goal status: INITIAL  5.  Patient will report being able to drive at least 45 minutes to return to work.  Baseline:  Goal status: INITIAL   PLAN:  PT FREQUENCY: 2x/week  PT DURATION: 6 weeks  PLANNED INTERVENTIONS: 97164- PT Re-evaluation, 97110-Therapeutic exercises, 97530- Therapeutic activity, 97112- Neuromuscular re-education, 97535- Self Care, 54098- Manual therapy, [phone]- Gait training, Patient/Family education, Balance training, Stair training, Taping, and Joint mobilization  PLAN FOR NEXT SESSION: Nustep, continue concentric hip, knee and lower leg muscle strengthening exercises, continue balance exercises, joint mobilizations, modalities as needed.    Sherral Dirocco, Student-PT 11/10/2023, 12:08 PM

## 2023-11-19 ENCOUNTER — Ambulatory Visit: Attending: Sports Medicine

## 2023-11-19 DIAGNOSIS — M25561 Pain in right knee: Secondary | ICD-10-CM | POA: Diagnosis present

## 2023-11-19 NOTE — Therapy (Signed)
 OUTPATIENT PHYSICAL THERAPY LOWER EXTREMITY TREATMENT   Patient Name: Tina Osborn MRN: [id number] DOB:09/07/1985, 38 y.o., female Today's Date: 11/19/2023  END OF SESSION:  PT End of Session - 11/19/23 0843     Visit Number 5    Number of Visits 12    Date for PT Re-Evaluation 01/14/24    PT Start Time 0845    PT Stop Time 0930    PT Time Calculation (min) 45 min    Activity Tolerance Patient tolerated treatment well    Behavior During Therapy [facility] for tasks assessed/performed             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   Irregular heart rate    Medical history non-contributory    Past Surgical History:  Procedure Laterality Date   LAPAROSCOPIC ASSISTED VAGINAL HYSTERECTOMY N/A 01/28/2022   Procedure: LAPAROSCOPIC ASSISTED VAGINAL HYSTERECTOMY;  Surgeon: Ozan, Jennifer, DO;  Location: [facility];  Service: Gynecology;  Laterality: N/A;   LAPAROSCOPIC BILATERAL SALPINGECTOMY Bilateral 01/28/2022   Procedure: LAPAROSCOPIC BILATERAL SALPINGECTOMY;  Surgeon: Ozan, Jennifer, DO;  Location: [facility];  Service: Gynecology;  Laterality: Bilateral;   NO PAST SURGERIES     Patient Active Problem List   Diagnosis Date Noted   Ineffective breast feeding 04/25/2013   HSV-2 seropositive     PCP: Omie Bickers, MD  REFERRING PROVIDER: Shermon Divine, MD  REFERRING DIAG: Right knee contusion/sprain   THERAPY DIAG:  Acute pain of right knee  Rationale for Evaluation and Treatment: Rehabilitation  ONSET DATE: March 9th, 2025  SUBJECTIVE:   SUBJECTIVE STATEMENT:  Patient reported slight soreness and stiffness today. She stated she is no longer wearing her knee brace and is just wearing a compression sleeve for support. Her physician advised her to wear her knee brace as needed. Pain is 0/10 at the start of treatment.   PERTINENT HISTORY: Hydrocortisone  allergy  PAIN:  Are you  having pain? Yes: NPRS scale: 0/10 Pain location: Right Knee Pain description: Sore, Throbbing Aggravating factors: Walking, driving, repeated bending Relieving factors: rest and hot pack  PRECAUTIONS: None  RED FLAGS: None   WEIGHT BEARING RESTRICTIONS: No  FALLS:  Has patient fallen in last 6 months? Yes. Number of falls 1  LIVING ENVIRONMENT: Lives with: lives with their family Lives in: House/apartment Stairs:  Yes: Internal: Front Porch 5 steps bilateral handrails, Garage 2 steps no handrails Has following equipment at home: Crutches  OCCUPATION: Advertising account planner  PLOF: Independent  PATIENT GOALS: Walk long distances, drive long distances, decrease pain, less difficulty with work activities.   NEXT MD VISIT: April 23rd, 2025  OBJECTIVE:  Note: Objective measures were completed at Evaluation unless otherwise noted.  DIAGNOSTIC FINDINGS:   Right knee X-Ray 09/26/2023 20:00: Negative   PATIENT SURVEYS:  LEFS 43/80  COGNITION: Overall cognitive status:  WFL      SENSATION: WFL  POSTURE: No Significant postural limitations  PALPATION: TTP of the right knee's medial joint line and pes anserine region  PASSIVE ACCESSORY MOTION  Right knee eval:  Tibiofemoral AP: Pain and stiffness Tibiofemoral PA: Pain and stiffness Patella: Pain with inferior and lateral patellar mobility   LOWER EXTREMITY ROM:  Active ROM Right eval Left eval  Hip flexion    Hip extension    Hip abduction    Hip adduction    Hip internal rotation    Hip external rotation  Knee flexion 126 degrees pain and tightness with end range OP   PROM 140 degrees pain at end range 135 degrees  Knee extension -2 degrees pain with end range OP   PROM: -3 degrees pain at end range -3 degrees  Ankle dorsiflexion    Ankle plantarflexion    Ankle inversion                 (Blank rows = not tested)    LOWER EXTREMITY MMT:  MMT Right eval Left eval  Hip flexion    Hip  extension    Hip abduction    Hip adduction    Hip internal rotation    Hip external rotation    Knee flexion 3+ 4+   Knee extension 3+ 4+   Ankle dorsiflexion    Ankle plantarflexion    Ankle inversion    Ankle eversion     (Blank rows = not tested)  LOWER EXTREMITY SPECIAL TESTS:  Knee special tests: Anterior drawer test: positive , McMurray's test: negative, and Varus test: negative and Valgus test: positive   GAIT: Distance walked: 82 feet Assistive device utilized: None Level of assistance: Complete Independence Comments: The patient demonstrated a compensatory gait pattern, favoring the left lower extremity during ambulation.   TREATMENT DATE:                                    11/19/23 EXERCISE LOG   Exercise Repetitions and Resistance Comments  Nustep   Level 4 15 minutes  TE  Toe taps   2 sets of 2 minute reps 8" box  TA   Step up/ downs   4 sets of 15 reps  TA performed slowly with emphasis on control with ascent and descent  Side step    2 sets of 2:30 red band   NR, performed slow  Marching on Airex    3 sets of 12 reps  NR, performed slowly with emphasis on control with ascent and descent   Blank cell = exercise not performed today   Manual Therapy   Joint Mobilizations: Right knee , Grade 3 tibiofemoral distraction+flexion to increase mobility.    Upon completion of manual therapy patient reported no adverse effects and increased mobility.                                     11/10/23 EXERCISE LOG   Exercise Repetitions and Resistance Comments  Nustep   Level 4 15 minutes   TE  Standing lunges   14" 3 minutes   TE  Standing marches on airex   3 sets of 12 reps  NR no parallel bar support  Standing heel/toe raises   3 sets of 12 reps   NR no parallel bar support  LAQ   2 sets of 12 reps 3#  NR   Standing knee flexion   2 sets of 12 reps 3#  NR       Blank cell = exercise not performed today   Manual Therapy   Joint Mobilizations: Right knee , Grade 3  tibiofemoral distraction+flexion to increase mobility.     Upon completion of manual therapy patient reported no adverse effects and increased mobility.  11/08/23 EXERCISE LOG   Exercise Repetitions and Resistance Comments  Nustep  Level 4 17 minutes  TE  Standing Lunges   14" 3 minutes  TE  Standing Knee Flexion  3 sets of 8 reps  NE both sides  Standing Hip Abduction   2 sets of 12 reps   NE both sides   Standing marches   3 sets of 12 reps   NE       Blank cell = exercise not performed today    PATIENT EDUCATION:  Education details: Patient educated on objective findings, POC, prognosis and goals for therapy.  Person educated: Patient Education method: Explanation Education comprehension: verbalized understanding  HOME EXERCISE PROGRAM: [id number]   ASSESSMENT:  CLINICAL IMPRESSION:  The patient responded well to treatment. She was provided verbal cueing for toe tap, side step, step up/step down exercises to ensure proper muscle engagement and body mechanics. She was provided joint mobilizations and upon completion reported no adverse effects, decreased stiffness and increased mobility. She reported no pain, slight soreness and fatigue at the end of the treatment session. Patient will continue to benefit from skilled PT to address impairments and decrease pain in order to maximize her functional mobility.     OBJECTIVE IMPAIRMENTS: decreased activity tolerance, decreased balance, decreased coordination, decreased endurance, decreased knowledge of condition, decreased mobility, difficulty walking, decreased ROM, decreased strength, hypomobility, increased edema, impaired flexibility, improper body mechanics, and pain.   ACTIVITY LIMITATIONS: carrying, lifting, bending, sitting, standing, squatting, stairs, and transfers  PARTICIPATION LIMITATIONS: cleaning, laundry, driving, shopping, community activity, occupation, and yard work  PERSONAL  FACTORS:  No external factors  are affecting patient's functional outcome.   REHAB POTENTIAL: Good  CLINICAL DECISION MAKING: Stable/uncomplicated  EVALUATION COMPLEXITY: Low   GOALS: Goals reviewed with patient? No  SHORT TERM GOALS: Target date: 11/19/2023 Patient will be independent with her HEP.  Baseline: Goal status: MET   2.  Patient's pain at its worst will decrease from 6/10 to 4/10 which will enable her to drive for longer periods of time.  Baseline:  Goal status: INITIAL  3.  Patient's knee flexion and extension MMT will increase to 4+ in order to help with walking.  Baseline:  Goal status: INITIAL  4.  The patients LEFS score will increase from 43 to 52 to ensure greater confidence in performing ADL's.   Baseline:  Goal status: INITIAL   LONG TERM GOALS: Target date: 12/10/2023  The patients LEFS score will increase from 52 to 64, which will ensure greater confidence in performing ADL's.  Baseline:  Goal status: INITIAL  2.  Patient will be independent with advanced HEP.  Baseline:  Goal status: INITIAL  3.  Patient's right knee flexion ROM will increase to 135, which will improve her ability to ascend and descend stairs.  Baseline:  Goal status: INITIAL  4.  Patient's pain at its worst will decrease from 4/10 to 2/10 which will ensure greater confidence to perform work activities.  Baseline:  Goal status: INITIAL  5.  Patient will report being able to drive at least 45 minutes to return to work.  Baseline:  Goal status: INITIAL   PLAN:  PT FREQUENCY: 2x/week  PT DURATION: 6 weeks  PLANNED INTERVENTIONS: 97164- PT Re-evaluation, 97110-Therapeutic exercises, 97530- Therapeutic activity, 97112- Neuromuscular re-education, 97535- Self Care, 24401- Manual therapy, [phone]- Gait training, Patient/Family education, Balance training, Stair training, Taping, and Joint mobilization  PLAN FOR NEXT SESSION: Nustep, progress concentric hip, knee and lower  leg  muscle strengthening exercises, continue balance exercises, joint mobilizations, modalities as needed.    Roise Emert, Student-PT 11/19/2023, 12:55 PM

## 2023-11-26 ENCOUNTER — Ambulatory Visit

## 2023-11-26 DIAGNOSIS — M25561 Pain in right knee: Secondary | ICD-10-CM

## 2023-11-26 NOTE — Therapy (Signed)
 OUTPATIENT PHYSICAL THERAPY LOWER EXTREMITY TREATMENT   Patient Name: Tina Osborn MRN: [id number] DOB:1986/06/24, 38 y.o., female Today's Date: 11/26/2023  END OF SESSION:  PT End of Session - 11/26/23 0859     Visit Number 6    Number of Visits 12    Date for PT Re-Evaluation 01/14/24    PT Start Time 0845    PT Stop Time 0930    PT Time Calculation (min) 45 min    Activity Tolerance Patient limited by pain    Behavior During Therapy [facility] for tasks assessed/performed             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   Irregular heart rate    Medical history non-contributory    Past Surgical History:  Procedure Laterality Date   LAPAROSCOPIC ASSISTED VAGINAL HYSTERECTOMY N/A 01/28/2022   Procedure: LAPAROSCOPIC ASSISTED VAGINAL HYSTERECTOMY;  Surgeon: Ozan, Jennifer, DO;  Location: [facility];  Service: Gynecology;  Laterality: N/A;   LAPAROSCOPIC BILATERAL SALPINGECTOMY Bilateral 01/28/2022   Procedure: LAPAROSCOPIC BILATERAL SALPINGECTOMY;  Surgeon: Ozan, Jennifer, DO;  Location: [facility];  Service: Gynecology;  Laterality: Bilateral;   NO PAST SURGERIES     Patient Active Problem List   Diagnosis Date Noted   Ineffective breast feeding 04/25/2013   HSV-2 seropositive     PCP: Omie Bickers, MD  REFERRING PROVIDER: Shermon Divine, MD  REFERRING DIAG: Right knee contusion/sprain   THERAPY DIAG:  Acute pain of right knee  Rationale for Evaluation and Treatment: Rehabilitation  ONSET DATE: March 9th, 2025  SUBJECTIVE:   SUBJECTIVE STATEMENT:  Patient reported she might have overworked herself yesterday. Her pain was 6/10 at the start of treatment.   PERTINENT HISTORY: Hydrocortisone  allergy, patient reported a history of Raynaud's disease (per patient report)   PAIN:  Are you having pain? Yes: NPRS scale: 6/10 Pain location: Right Knee Pain description: Sore,  Throbbing Aggravating factors: Walking, driving, repeated bending Relieving factors: rest and hot pack  PRECAUTIONS: None  RED FLAGS: None   WEIGHT BEARING RESTRICTIONS: No  FALLS:  Has patient fallen in last 6 months? Yes. Number of falls 1  LIVING ENVIRONMENT: Lives with: lives with their family Lives in: House/apartment Stairs: Yes: Internal: Front Porch 5 steps bilateral handrails, Garage 2 steps no handrails Has following equipment at home: Crutches  OCCUPATION: Advertising account planner  PLOF: Independent  PATIENT GOALS: Walk long distances, drive long distances, decrease pain, less difficulty with work activities.   NEXT MD VISIT: April 23rd, 2025  OBJECTIVE:  Note: Objective measures were completed at Evaluation unless otherwise noted.  DIAGNOSTIC FINDINGS:   Right knee X-Ray 09/26/2023 20:00: Negative   PATIENT SURVEYS:  LEFS 43/80  COGNITION: Overall cognitive status: WFL     SENSATION: WFL  POSTURE: No Significant postural limitations  PALPATION: TTP of the right knee's medial joint line and pes anserine region  PASSIVE ACCESSORY MOTION  Right knee eval:  Tibiofemoral AP: Pain and stiffness Tibiofemoral PA: Pain and stiffness Patella: Pain with inferior and lateral patellar mobility   LOWER EXTREMITY ROM:  Active ROM Right eval Left eval  Hip flexion    Hip extension    Hip abduction    Hip adduction    Hip internal rotation    Hip external rotation    Knee flexion 126 degrees pain and tightness with end range OP   PROM 140 degrees pain at  end range 135 degrees  Knee extension -2 degrees pain with end range OP   PROM: -3 degrees pain at end range -3 degrees  Ankle dorsiflexion    Ankle plantarflexion    Ankle inversion                 (Blank rows = not tested)    LOWER EXTREMITY MMT:  MMT Right eval Left eval  Hip flexion    Hip extension    Hip abduction    Hip adduction    Hip internal rotation    Hip external rotation     Knee flexion 3+ 4+   Knee extension 3+ 4+   Ankle dorsiflexion    Ankle plantarflexion    Ankle inversion    Ankle eversion     (Blank rows = not tested)  LOWER EXTREMITY SPECIAL TESTS:  Knee special tests: Anterior drawer test: positive , McMurray's test: negative, and Varus test: negative and Valgus test: positive   GAIT: Distance walked: 82 feet Assistive device utilized: None Level of assistance: Complete Independence Comments: The patient demonstrated a compensatory gait pattern, favoring the left lower extremity during ambulation.   TREATMENT DATE:                                     11/26/23 EXERCISE LOG   Exercise Repetitions and Resistance Comments  Nustep  Level 4 15 minutes    Lunge at step   5 minutes                 Blank cell = exercise not performed today   Manual Therapy  Soft Tissue Mobilization: Right knee, STM to quadriceps to increase soft tissue extensibility and decrease pain  Joint Mobilizations: Right knee, Grade 3 tibiofemoral distraction+flexion to increase mobility.     Upon completion of manual therapy patient reported no adverse effects and increased mobility.                                    11/19/23 EXERCISE LOG   Exercise Repetitions and Resistance Comments  Nustep   Level 4 15 minutes  TE  Toe taps   2 sets of 2 minute reps 8" box  TA   Step up/ downs   4 sets of 15 reps  TA performed slowly with emphasis on control with ascent and descent  Side step    2 sets of 2:30 red band   NR, performed slow  Marching on Airex    3 sets of 12 reps  NR, performed slowly with emphasis on control with ascent and descent   Blank cell = exercise not performed today   Manual Therapy   Joint Mobilizations: Right knee , Grade 3 tibiofemoral distraction+flexion to increase mobility.    Upon completion of manual therapy patient reported no adverse effects and increased mobility.                                     11/10/23 EXERCISE LOG    Exercise Repetitions and Resistance Comments  Nustep   Level 4 15 minutes   TE  Standing lunges   14" 3 minutes   TE  Standing marches on airex   3 sets of 12 reps  NR  no parallel bar support  Standing heel/toe raises   3 sets of 12 reps   NR no parallel bar support  LAQ   2 sets of 12 reps 3#  NR   Standing knee flexion   2 sets of 12 reps 3#  NR       Blank cell = exercise not performed today   Manual Therapy   Joint Mobilizations: Right knee , Grade 3 tibiofemoral distraction+flexion to increase mobility.    Upon completion of manual therapy patient reported no adverse effects and increased mobility.    PATIENT EDUCATION:  Education details: Patient educated on objective findings, POC, prognosis and goals for therapy.  Person educated: Patient Education method: Explanation Education comprehension: verbalized understanding  HOME EXERCISE PROGRAM: [id number]   ASSESSMENT:  CLINICAL IMPRESSION:  The patient's treatment was modified due to right knee pain. She experienced a slightly painful pop sensation near her lateral tibiofemoral joint line in the midst of warming up on the nustep that subsided after continuing to move that knee. She was provided with STM/joint glides which decreased pain and increased mobility. She was provided with education regarding activity modifications and verbalized compliance. She reported a 4/10 pain and increased mobility upon completion of treatment session. Patient will benefit from skilled PT in order address impairments and decrease pain while improving ability in order to maximize functional mobility.         OBJECTIVE IMPAIRMENTS: decreased activity tolerance, decreased balance, decreased coordination, decreased endurance, decreased knowledge of condition, decreased mobility, difficulty walking, decreased ROM, decreased strength, hypomobility, increased edema, impaired flexibility, improper body mechanics, and pain.   ACTIVITY LIMITATIONS:  carrying, lifting, bending, sitting, standing, squatting, stairs, and transfers  PARTICIPATION LIMITATIONS: cleaning, laundry, driving, shopping, community activity, occupation, and yard work  PERSONAL FACTORS: No external factors are affecting patient's functional outcome.   REHAB POTENTIAL: Good  CLINICAL DECISION MAKING: Stable/uncomplicated  EVALUATION COMPLEXITY: Low   GOALS: Goals reviewed with patient? No  SHORT TERM GOALS: Target date: 11/19/2023 Patient will be independent with her HEP.  Baseline: Goal status: MET   2.  Patient's pain at its worst will decrease from 6/10 to 4/10 which will enable her to drive for longer periods of time.  Baseline:  Goal status: MET   3.  Patient's knee flexion and extension MMT will increase to 4+ in order to help with walking.  Baseline:  Goal status: INITIAL  4.  The patients LEFS score will increase from 43 to 52 to ensure greater confidence in performing ADL's.   Baseline:  Goal status: INITIAL   LONG TERM GOALS: Target date: 12/10/2023  The patients LEFS score will increase from 52 to 64, which will ensure greater confidence in performing ADL's.  Baseline:  Goal status: INITIAL  2.  Patient will be independent with advanced HEP.  Baseline:  Goal status: INITIAL  3.  Patient's right knee flexion ROM will increase to 135, which will improve her ability to ascend and descend stairs.  Baseline:  Goal status: INITIAL  4.  Patient's pain at its worst will decrease from 4/10 to 2/10 which will ensure greater confidence to perform work activities.  Baseline:  Goal status: INITIAL  5.  Patient will report being able to drive at least 45 minutes to return to work.  Baseline:  Goal status: INITIAL   PLAN:  PT FREQUENCY: 2x/week  PT DURATION: 6 weeks  PLANNED INTERVENTIONS: 97164- PT Re-evaluation, 97110-Therapeutic exercises, 97530- Therapeutic activity, [id number]- Neuromuscular re-education, 97535- Self Care,  [id number]-  Manual therapy, [phone]- Gait training, Patient/Family education, Balance training, Stair training, Taping, and Joint mobilization  PLAN FOR NEXT SESSION: Nustep, progress concentric hip, knee and lower leg muscle strengthening exercises, continue balance exercises, joint mobilizations, modalities as needed.    Loribeth Katich, Student-PT 11/26/2023, 11:50 AM

## 2023-12-03 ENCOUNTER — Ambulatory Visit

## 2023-12-03 DIAGNOSIS — M25561 Pain in right knee: Secondary | ICD-10-CM

## 2023-12-03 NOTE — Therapy (Signed)
 OUTPATIENT PHYSICAL THERAPY LOWER EXTREMITY TREATMENT   Patient Name: Tina Osborn MRN: [id number] DOB:1986-05-25, 38 y.o., female Today's Date: 12/03/2023  END OF SESSION:  PT End of Session - 12/03/23 0811     Visit Number 7    Number of Visits 12    Date for PT Re-Evaluation 01/14/24    PT Start Time 0801    PT Stop Time 0850    PT Time Calculation (min) 49 min    Activity Tolerance Patient tolerated treatment well    Behavior During Therapy [facility] for tasks assessed/performed              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   Irregular heart rate    Medical history non-contributory    Past Surgical History:  Procedure Laterality Date   LAPAROSCOPIC ASSISTED VAGINAL HYSTERECTOMY N/A 01/28/2022   Procedure: LAPAROSCOPIC ASSISTED VAGINAL HYSTERECTOMY;  Surgeon: Ozan, Jennifer, DO;  Location: [facility];  Service: Gynecology;  Laterality: N/A;   LAPAROSCOPIC BILATERAL SALPINGECTOMY Bilateral 01/28/2022   Procedure: LAPAROSCOPIC BILATERAL SALPINGECTOMY;  Surgeon: Ozan, Jennifer, DO;  Location: [facility];  Service: Gynecology;  Laterality: Bilateral;   NO PAST SURGERIES     Patient Active Problem List   Diagnosis Date Noted   Ineffective breast feeding 04/25/2013   HSV-2 seropositive     PCP: Omie Bickers, MD  REFERRING PROVIDER: Shermon Divine, MD  REFERRING DIAG: Right knee contusion/sprain   THERAPY DIAG:  Acute pain of right knee  Rationale for Evaluation and Treatment: Rehabilitation  ONSET DATE: March 9th, 2025  SUBJECTIVE:   SUBJECTIVE STATEMENT:  Patient reports that she is a little sore, but she is not hurting. She has not really hurt since her knee popped last week.   PERTINENT HISTORY: Hydrocortisone  allergy, patient reported a history of Raynaud's disease (per patient report)   PAIN:  Are you having pain? Yes: NPRS scale: 0/10 Pain location: Right  Knee Pain description: Sore, Throbbing Aggravating factors: Walking, driving, repeated bending Relieving factors: rest and hot pack  PRECAUTIONS: None  RED FLAGS: None   WEIGHT BEARING RESTRICTIONS: No  FALLS:  Has patient fallen in last 6 months? Yes. Number of falls 1  LIVING ENVIRONMENT: Lives with: lives with their family Lives in: House/apartment Stairs: Yes: Internal: Front Porch 5 steps bilateral handrails, Garage 2 steps no handrails Has following equipment at home: Crutches  OCCUPATION: Advertising account planner  PLOF: Independent  PATIENT GOALS: Walk long distances, drive long distances, decrease pain, less difficulty with work activities.   NEXT MD VISIT: April 23rd, 2025  OBJECTIVE:  Note: Objective measures were completed at Evaluation unless otherwise noted.  DIAGNOSTIC FINDINGS:   Right knee X-Ray 09/26/2023 20:00: Negative   PATIENT SURVEYS:  LEFS 43/80  COGNITION: Overall cognitive status: WFL     SENSATION: WFL  POSTURE: No Significant postural limitations  PALPATION: TTP of the right knee's medial joint line and pes anserine region  PASSIVE ACCESSORY MOTION  Right knee eval:  Tibiofemoral AP: Pain and stiffness Tibiofemoral PA: Pain and stiffness Patella: Pain with inferior and lateral patellar mobility   LOWER EXTREMITY ROM:  Active ROM Right eval Left eval  Hip flexion    Hip extension    Hip abduction    Hip adduction    Hip internal rotation    Hip external rotation    Knee flexion 126 degrees pain and tightness with end range  OP   PROM 140 degrees pain at end range 135 degrees  Knee extension -2 degrees pain with end range OP   PROM: -3 degrees pain at end range -3 degrees  Ankle dorsiflexion    Ankle plantarflexion    Ankle inversion                 (Blank rows = not tested)    LOWER EXTREMITY MMT:  MMT Right eval Left eval  Hip flexion    Hip extension    Hip abduction    Hip adduction    Hip internal  rotation    Hip external rotation    Knee flexion 3+ 4+   Knee extension 3+ 4+   Ankle dorsiflexion    Ankle plantarflexion    Ankle inversion    Ankle eversion     (Blank rows = not tested)  LOWER EXTREMITY SPECIAL TESTS:  Knee special tests: Anterior drawer test: positive , McMurray's test: negative, and Varus test: negative and Valgus test: positive   GAIT: Distance walked: 82 feet Assistive device utilized: None Level of assistance: Complete Independence Comments: The patient demonstrated a compensatory gait pattern, favoring the left lower extremity during ambulation.   TREATMENT DATE:                                   12/03/23 EXERCISE LOG  Exercise Repetitions and Resistance Comments  Nustep  L4 x 18.5 minutes    Side stepping on foam  3 minutes  Without external support  Tandem walking on foam  3 minutes  Forward only; without external support   Rocker board  5 minutes BUE support from parallel bars  Standing HS stretch  4 x 30 seconds  RLE only   Standing heel raise 10 reps     Blank cell = exercise not performed today                                     11/26/23 EXERCISE LOG   Exercise Repetitions and Resistance Comments  Nustep  Level 4 15 minutes    Lunge at step   5 minutes                 Blank cell = exercise not performed today   Manual Therapy  Soft Tissue Mobilization: Right knee, STM to quadriceps to increase soft tissue extensibility and decrease pain  Joint Mobilizations: Right knee, Grade 3 tibiofemoral distraction+flexion to increase mobility.     Upon completion of manual therapy patient reported no adverse effects and increased mobility.                                    11/19/23 EXERCISE LOG   Exercise Repetitions and Resistance Comments  Nustep   Level 4 15 minutes  TE  Toe taps   2 sets of 2 minute reps 8" box  TA   Step up/ downs   4 sets of 15 reps  TA performed slowly with emphasis on control with ascent and descent  Side step     2 sets of 2:30 red band   NR, performed slow  Marching on Airex    3 sets of 12 reps  NR, performed slowly with emphasis on control with  ascent and descent   Blank cell = exercise not performed today   Manual Therapy   Joint Mobilizations: Right knee , Grade 3 tibiofemoral distraction+flexion to increase mobility.    Upon completion of manual therapy patient reported no adverse effects and increased mobility.   PATIENT EDUCATION:  Education details: Patient educated on objective findings, POC, prognosis and goals for therapy.  Person educated: Patient Education method: Explanation Education comprehension: verbalized understanding  HOME EXERCISE PROGRAM: [id number]   ASSESSMENT:  CLINICAL IMPRESSION:  Patient was progressed with multiple new interventions for improved balance and lower extremity stability. She required minimal cueing with today's new interventions for proper exercise performance. Her HEP was updated with heel raises and tandem balance at a counter. She reported feeling comfortable with these interventions. She reported feeling good upon the conclusion of treatment. Recommend that she focus on her balance and lower extremity stability with her upcoming appointment to maximize her safety.   OBJECTIVE IMPAIRMENTS: decreased activity tolerance, decreased balance, decreased coordination, decreased endurance, decreased knowledge of condition, decreased mobility, difficulty walking, decreased ROM, decreased strength, hypomobility, increased edema, impaired flexibility, improper body mechanics, and pain.   ACTIVITY LIMITATIONS: carrying, lifting, bending, sitting, standing, squatting, stairs, and transfers  PARTICIPATION LIMITATIONS: cleaning, laundry, driving, shopping, community activity, occupation, and yard work  PERSONAL FACTORS: No external factors are affecting patient's functional outcome.   REHAB POTENTIAL: Good  CLINICAL DECISION MAKING:  Stable/uncomplicated  EVALUATION COMPLEXITY: Low   GOALS: Goals reviewed with patient? No  SHORT TERM GOALS: Target date: 11/19/2023 Patient will be independent with her HEP.  Baseline: Goal status: MET   2.  Patient's pain at its worst will decrease from 6/10 to 4/10 which will enable her to drive for longer periods of time.  Baseline:  Goal status: MET   3.  Patient's knee flexion and extension MMT will increase to 4+ in order to help with walking.  Baseline: 4+/5 Goal status: MET  4.  The patients LEFS score will increase from 43 to 52 to ensure greater confidence in performing ADL's.   Baseline: 68/80 Goal status: MET   LONG TERM GOALS: Target date: 12/10/2023  The patients LEFS score will increase from 52 to 64, which will ensure greater confidence in performing ADL's.  Baseline: 68/80 Goal status: MET  2.  Patient will be independent with advanced HEP.  Baseline:  Goal status: MET  3.  Patient's right knee flexion ROM will increase to 135, which will improve her ability to ascend and descend stairs.  Baseline:  Goal status: INITIAL  4.  Patient's pain at its worst will decrease from 4/10 to 2/10 which will ensure greater confidence to perform work activities.  Baseline: 1-2/10 since 11/26/23 Goal status: MET  5.  Patient will report being able to drive at least 45 minutes to return to work.  Baseline:  Goal status: MET   PLAN:  PT FREQUENCY: 2x/week  PT DURATION: 6 weeks  PLANNED INTERVENTIONS: 97164- PT Re-evaluation, 97110-Therapeutic exercises, 97530- Therapeutic activity, 97112- Neuromuscular re-education, 97535- Self Care, 16109- Manual therapy, [phone]- Gait training, Patient/Family education, Balance training, Stair training, Taping, and Joint mobilization  PLAN FOR NEXT SESSION: Nustep, progress concentric hip, knee and lower leg muscle strengthening exercises, continue balance exercises, joint mobilizations, modalities as needed.    Lane Pinon, PT 12/03/2023, 11:54 AM

## 2023-12-10 ENCOUNTER — Ambulatory Visit

## 2023-12-10 DIAGNOSIS — M25561 Pain in right knee: Secondary | ICD-10-CM

## 2023-12-10 NOTE — Therapy (Signed)
 OUTPATIENT PHYSICAL THERAPY LOWER EXTREMITY TREATMENT   Patient Name: Tina Osborn MRN: [id number] DOB:Aug 18, 1985, 38 y.o., female Today's Date: 12/10/2023  END OF SESSION:  PT End of Session - 12/10/23 0819     Visit Number 8    Number of Visits 12    Date for PT Re-Evaluation 01/14/24    PT Start Time 0813   Patient arrived late to her appointment.   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   Irregular heart rate    Medical history non-contributory    Past Surgical History:  Procedure Laterality Date   LAPAROSCOPIC ASSISTED VAGINAL HYSTERECTOMY N/A 01/28/2022   Procedure: LAPAROSCOPIC ASSISTED VAGINAL HYSTERECTOMY;  Surgeon: Ozan, Jennifer, DO;  Location: [facility];  Service: Gynecology;  Laterality: N/A;   LAPAROSCOPIC BILATERAL SALPINGECTOMY Bilateral 01/28/2022   Procedure: LAPAROSCOPIC BILATERAL SALPINGECTOMY;  Surgeon: Ozan, Jennifer, DO;  Location: [facility];  Service: Gynecology;  Laterality: Bilateral;   NO PAST SURGERIES     Patient Active Problem List   Diagnosis Date Noted   Ineffective breast feeding 04/25/2013   HSV-2 seropositive     PCP: Omie Bickers, MD  REFERRING PROVIDER: Shermon Divine, MD  REFERRING DIAG: Right knee contusion/sprain   THERAPY DIAG:  Acute pain of right knee  Rationale for Evaluation and Treatment: Rehabilitation  ONSET DATE: March 9th, 2025  SUBJECTIVE:   SUBJECTIVE STATEMENT:  Patient reports that she is not hurting today. However, she tried to run after her dog over the weekend and kneeling on her right knee which really hurt.   PERTINENT HISTORY: Hydrocortisone  allergy, patient reported a history of Raynaud's disease (per patient report)   PAIN:   Are you having pain? Yes: NPRS scale: 0/10 Pain location: Right Knee Pain description: Sore, Throbbing Aggravating factors: Walking, driving, repeated bending Relieving factors: rest and hot pack  PRECAUTIONS: None  RED FLAGS: None   WEIGHT BEARING RESTRICTIONS: No  FALLS:  Has patient fallen in last 6 months? Yes. Number of falls 1  LIVING ENVIRONMENT: Lives with: lives with their family Lives in: House/apartment Stairs: Yes: Internal: Front Porch 5 steps bilateral handrails, Garage 2 steps no handrails Has following equipment at home: Crutches  OCCUPATION: Advertising account planner  PLOF: Independent  PATIENT GOALS: Walk long distances, drive long distances, decrease pain, less difficulty with work activities.   NEXT MD VISIT: April 23rd, 2025  OBJECTIVE:  Note: Objective measures were completed at Evaluation unless otherwise noted.  DIAGNOSTIC FINDINGS:   Right knee X-Ray 09/26/2023 20:00: Negative   PATIENT SURVEYS:  LEFS 43/80  COGNITION: Overall cognitive status: WFL     SENSATION: WFL  POSTURE: No Significant postural limitations  PALPATION: TTP of the right knee's medial joint line and pes anserine region  PASSIVE ACCESSORY MOTION  Right knee eval:  Tibiofemoral AP: Pain and stiffness Tibiofemoral PA: Pain and stiffness Patella: Pain with inferior and lateral patellar mobility   LOWER EXTREMITY ROM:  Active ROM Right eval Left eval  Hip flexion    Hip extension    Hip abduction    Hip adduction    Hip internal rotation    Hip external rotation  Knee flexion 126 degrees pain and tightness with end range OP   PROM 140 degrees pain at end range 135 degrees  Knee extension -2 degrees pain with end range OP   PROM: -3 degrees pain at end range -3 degrees  Ankle dorsiflexion    Ankle plantarflexion    Ankle inversion                 (Blank rows = not tested)    LOWER EXTREMITY MMT:  MMT Right eval Left eval  Hip flexion    Hip  extension    Hip abduction    Hip adduction    Hip internal rotation    Hip external rotation    Knee flexion 3+ 4+   Knee extension 3+ 4+   Ankle dorsiflexion    Ankle plantarflexion    Ankle inversion    Ankle eversion     (Blank rows = not tested)  LOWER EXTREMITY SPECIAL TESTS:  Knee special tests: Anterior drawer test: positive , McMurray's test: negative, and Varus test: negative and Valgus test: positive   GAIT: Distance walked: 82 feet Assistive device utilized: None Level of assistance: Complete Independence Comments: The patient demonstrated a compensatory gait pattern, favoring the left lower extremity during ambulation.   TREATMENT DATE:                                   12/10/23 EXERCISE LOG  Exercise Repetitions and Resistance Comments  Nustep  L5 x 14 minutes   Lunges onto BOSU 2.5 minutes  Alternating LE   Rocker board  2 minutes each  AP and lateral; intermittent UE support  Side stepping on foam  3 minutes Without UE support  Tandem walking on foam  3 minutes   SLS on firm surface 7 x 15 seconds each  Without UE support   Blank cell = exercise not performed today                                    12/03/23 EXERCISE LOG  Exercise Repetitions and Resistance Comments  Nustep  L4 x 18.5 minutes    Side stepping on foam  3 minutes  Without external support  Tandem walking on foam  3 minutes  Forward only; without external support   Rocker board  5 minutes BUE support from parallel bars  Standing HS stretch  4 x 30 seconds  RLE only   Standing heel raise 10 reps     Blank cell = exercise not performed today                                     11/26/23 EXERCISE LOG   Exercise Repetitions and Resistance Comments  Nustep  Level 4 15 minutes    Lunge at step   5 minutes                 Blank cell = exercise not performed today   Manual Therapy  Soft Tissue Mobilization: Right knee, STM to quadriceps to increase soft tissue extensibility and decrease  pain  Joint Mobilizations: Right knee, Grade 3 tibiofemoral distraction+flexion to increase mobility.     Upon completion of manual therapy patient reported no adverse effects and increased mobility.  PATIENT EDUCATION:  Education details: Patient educated on objective findings, POC, prognosis and goals for therapy.  Person educated: Patient Education method: Explanation Education comprehension: verbalized understanding  HOME EXERCISE PROGRAM: [id number]   ASSESSMENT:  CLINICAL IMPRESSION:  Patient was progressed with multiple new interventions for improved static and dynamic stability. She required minimal cueing with today's new interventions for proper exercise performance. She experienced a mild increase in discomfort with today's new standing interventions, but this did not limit her ability to complete any of today's interventions. She reported that her knee felt a little sore (1/10) upon the conclusion of treatment. She continues to require skilled physical therapy to address her remaining impairments to return to her prior level of function.   OBJECTIVE IMPAIRMENTS: decreased activity tolerance, decreased balance, decreased coordination, decreased endurance, decreased knowledge of condition, decreased mobility, difficulty walking, decreased ROM, decreased strength, hypomobility, increased edema, impaired flexibility, improper body mechanics, and pain.   ACTIVITY LIMITATIONS: carrying, lifting, bending, sitting, standing, squatting, stairs, and transfers  PARTICIPATION LIMITATIONS: cleaning, laundry, driving, shopping, community activity, occupation, and yard work  PERSONAL FACTORS: No external factors are affecting patient's functional outcome.   REHAB POTENTIAL: Good  CLINICAL DECISION MAKING: Stable/uncomplicated  EVALUATION COMPLEXITY: Low   GOALS: Goals reviewed with patient? No  SHORT TERM GOALS: Target date: 11/19/2023 Patient will be independent with her HEP.   Baseline: Goal status: MET   2.  Patient's pain at its worst will decrease from 6/10 to 4/10 which will enable her to drive for longer periods of time.  Baseline:  Goal status: MET   3.  Patient's knee flexion and extension MMT will increase to 4+ in order to help with walking.  Baseline: 4+/5 Goal status: MET  4.  The patients LEFS score will increase from 43 to 52 to ensure greater confidence in performing ADL's.   Baseline: 68/80 Goal status: MET   LONG TERM GOALS: Target date: 12/10/2023  The patients LEFS score will increase from 52 to 64, which will ensure greater confidence in performing ADL's.  Baseline: 68/80 Goal status: MET  2.  Patient will be independent with advanced HEP.  Baseline:  Goal status: MET  3.  Patient's right knee flexion ROM will increase to 135, which will improve her ability to ascend and descend stairs.  Baseline:  Goal status: INITIAL  4.  Patient's pain at its worst will decrease from 4/10 to 2/10 which will ensure greater confidence to perform work activities.  Baseline: 1-2/10 since 11/26/23 Goal status: MET  5.  Patient will report being able to drive at least 45 minutes to return to work.  Baseline:  Goal status: MET   PLAN:  PT FREQUENCY: 2x/week  PT DURATION: 6 weeks  PLANNED INTERVENTIONS: 97164- PT Re-evaluation, 97110-Therapeutic exercises, 97530- Therapeutic activity, 97112- Neuromuscular re-education, 97535- Self Care, 16109- Manual therapy, [phone]- Gait training, Patient/Family education, Balance training, Stair training, Taping, and Joint mobilization  PLAN FOR NEXT SESSION: Nustep, progress concentric hip, knee and lower leg muscle strengthening exercises, continue balance exercises, joint mobilizations, modalities as needed.    Lane Pinon, PT 12/10/2023, 11:44 AM

## 2023-12-17 ENCOUNTER — Ambulatory Visit

## 2023-12-17 DIAGNOSIS — M25561 Pain in right knee: Secondary | ICD-10-CM | POA: Diagnosis not present

## 2023-12-17 NOTE — Therapy (Signed)
 OUTPATIENT PHYSICAL THERAPY LOWER EXTREMITY TREATMENT   Patient Name: Tina Osborn MRN: [id number] DOB:Jan 19, 1986, 38 y.o., female Today's Date: 12/17/2023  END OF SESSION:  PT End of Session - 12/17/23 0859     Visit Number 9    Number of Visits 12    Date for PT Re-Evaluation 01/14/24    PT Start Time 0845    PT Stop Time 0930    PT Time Calculation (min) 45 min    Activity Tolerance Patient tolerated treatment well    Behavior During Therapy [facility] for tasks assessed/performed                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   Irregular heart rate    Medical history non-contributory    Past Surgical History:  Procedure Laterality Date   LAPAROSCOPIC ASSISTED VAGINAL HYSTERECTOMY N/A 01/28/2022   Procedure: LAPAROSCOPIC ASSISTED VAGINAL HYSTERECTOMY;  Surgeon: Ozan, Jennifer, DO;  Location: [facility];  Service: Gynecology;  Laterality: N/A;   LAPAROSCOPIC BILATERAL SALPINGECTOMY Bilateral 01/28/2022   Procedure: LAPAROSCOPIC BILATERAL SALPINGECTOMY;  Surgeon: Ozan, Jennifer, DO;  Location: [facility];  Service: Gynecology;  Laterality: Bilateral;   NO PAST SURGERIES     Patient Active Problem List   Diagnosis Date Noted   Ineffective breast feeding 04/25/2013   HSV-2 seropositive     PCP: Omie Bickers, MD  REFERRING PROVIDER: Shermon Divine, MD  REFERRING DIAG: Right knee contusion/sprain   THERAPY DIAG:  Acute pain of right knee  Rationale for Evaluation and Treatment: Rehabilitation  ONSET DATE: March 9th, 2025  SUBJECTIVE:   SUBJECTIVE STATEMENT:  Patient reports that she feels a lot better. However, she still can't kneel yet due to pain.   PERTINENT HISTORY: Hydrocortisone  allergy, patient reported a history of Raynaud's disease (per patient report)   PAIN:  Are you having pain? Yes: NPRS scale: 0/10 Pain location: Right Knee Pain description: Sore,  Throbbing Aggravating factors: Walking, driving, repeated bending Relieving factors: rest and hot pack  PRECAUTIONS: None  RED FLAGS: None   WEIGHT BEARING RESTRICTIONS: No  FALLS:  Has patient fallen in last 6 months? Yes. Number of falls 1  LIVING ENVIRONMENT: Lives with: lives with their family Lives in: House/apartment Stairs: Yes: Internal: Front Porch 5 steps bilateral handrails, Garage 2 steps no handrails Has following equipment at home: Crutches  OCCUPATION: Advertising account planner  PLOF: Independent  PATIENT GOALS: Walk long distances, drive long distances, decrease pain, less difficulty with work activities.   NEXT MD VISIT: April 23rd, 2025  OBJECTIVE:  Note: Objective measures were completed at Evaluation unless otherwise noted.  DIAGNOSTIC FINDINGS:   Right knee X-Ray 09/26/2023 20:00: Negative   PATIENT SURVEYS:  LEFS 43/80  COGNITION: Overall cognitive status: WFL     SENSATION: WFL  POSTURE: No Significant postural limitations  PALPATION: TTP of the right knee's medial joint line and pes anserine region  PASSIVE ACCESSORY MOTION  Right knee eval:  Tibiofemoral AP: Pain and stiffness Tibiofemoral PA: Pain and stiffness Patella: Pain with inferior and lateral patellar mobility   LOWER EXTREMITY ROM:  Active ROM Right eval Left eval  Hip flexion    Hip extension    Hip abduction    Hip adduction    Hip internal rotation    Hip external rotation    Knee flexion 126 degrees pain and tightness with end range OP   PROM 140  degrees pain at end range 135 degrees  Knee extension -2 degrees pain with end range OP   PROM: -3 degrees pain at end range -3 degrees  Ankle dorsiflexion    Ankle plantarflexion    Ankle inversion                 (Blank rows = not tested)    LOWER EXTREMITY MMT:  MMT Right eval Left eval  Hip flexion    Hip extension    Hip abduction    Hip adduction    Hip internal rotation    Hip external rotation     Knee flexion 3+ 4+   Knee extension 3+ 4+   Ankle dorsiflexion    Ankle plantarflexion    Ankle inversion    Ankle eversion     (Blank rows = not tested)  LOWER EXTREMITY SPECIAL TESTS:  Knee special tests: Anterior drawer test: positive , McMurray's test: negative, and Varus test: negative and Valgus test: positive   GAIT: Distance walked: 82 feet Assistive device utilized: None Level of assistance: Complete Independence Comments: The patient demonstrated a compensatory gait pattern, favoring the left lower extremity during ambulation.   TREATMENT DATE:                                   12/17/23 EXERCISE LOG  Exercise Repetitions and Resistance Comments  Nustep  L5 x 15 minutes   Wall squat  3 minutes With ball behind her back   Static stance on BOSU  Ball down x 2 minutes Infrequent UE support  Static stance on BOSU  Ball down x 2 minutes With perturbations   Marching on [employer] down x 3 minutes 1 HHA   Lunges to [employer] 25 reps each  Touching knee to the [employer]  2 minutes each  AP and lateral; intermittent UE support   Blank cell = exercise not performed today                                    12/10/23 EXERCISE LOG  Exercise Repetitions and Resistance Comments  Nustep  L5 x 14 minutes   Lunges onto BOSU 2.5 minutes  Alternating LE   Rocker board  2 minutes each  AP and lateral; intermittent UE support  Side stepping on foam  3 minutes Without UE support  Tandem walking on foam  3 minutes   SLS on firm surface 7 x 15 seconds each  Without UE support   Blank cell = exercise not performed today                                    12/03/23 EXERCISE LOG  Exercise Repetitions and Resistance Comments  Nustep  L4 x 18.5 minutes    Side stepping on foam  3 minutes  Without external support  Tandem walking on foam  3 minutes  Forward only; without external support   Rocker board  5 minutes BUE support from parallel bars  Standing HS stretch  4 x 30 seconds  RLE  only   Standing heel raise 10 reps     Blank cell = exercise not performed today   PATIENT EDUCATION:  Education details: Patient educated on objective findings, POC,  prognosis and goals for therapy.  Person educated: Patient Education method: Explanation Education comprehension: verbalized understanding  HOME EXERCISE PROGRAM: [id number]   ASSESSMENT:  CLINICAL IMPRESSION:  Patient was introduced to multiple new interventions for improved dynamic stability. She required intermittent external support with today's new interventions to prevent a loss of balance. She experienced no increase in pain or discomfort with any of today's interventions. She reported feeling tired upon the conclusion of treatment. She continues to require skilled physical therapy to address her remaining impairments to return to her prior level of function.   OBJECTIVE IMPAIRMENTS: decreased activity tolerance, decreased balance, decreased coordination, decreased endurance, decreased knowledge of condition, decreased mobility, difficulty walking, decreased ROM, decreased strength, hypomobility, increased edema, impaired flexibility, improper body mechanics, and pain.   ACTIVITY LIMITATIONS: carrying, lifting, bending, sitting, standing, squatting, stairs, and transfers  PARTICIPATION LIMITATIONS: cleaning, laundry, driving, shopping, community activity, occupation, and yard work  PERSONAL FACTORS: No external factors are affecting patient's functional outcome.   REHAB POTENTIAL: Good  CLINICAL DECISION MAKING: Stable/uncomplicated  EVALUATION COMPLEXITY: Low   GOALS: Goals reviewed with patient? No  SHORT TERM GOALS: Target date: 11/19/2023 Patient will be independent with her HEP.  Baseline: Goal status: MET   2.  Patient's pain at its worst will decrease from 6/10 to 4/10 which will enable her to drive for longer periods of time.  Baseline:  Goal status: MET   3.  Patient's knee flexion and  extension MMT will increase to 4+ in order to help with walking.  Baseline: 4+/5 Goal status: MET  4.  The patients LEFS score will increase from 43 to 52 to ensure greater confidence in performing ADL's.   Baseline: 68/80 Goal status: MET   LONG TERM GOALS: Target date: 12/10/2023  The patients LEFS score will increase from 52 to 64, which will ensure greater confidence in performing ADL's.  Baseline: 68/80 Goal status: MET  2.  Patient will be independent with advanced HEP.  Baseline:  Goal status: MET  3.  Patient's right knee flexion ROM will increase to 135, which will improve her ability to ascend and descend stairs.  Baseline:  Goal status: INITIAL  4.  Patient's pain at its worst will decrease from 4/10 to 2/10 which will ensure greater confidence to perform work activities.  Baseline: 1-2/10 since 11/26/23 Goal status: MET  5.  Patient will report being able to drive at least 45 minutes to return to work.  Baseline:  Goal status: MET   PLAN:  PT FREQUENCY: 2x/week  PT DURATION: 6 weeks  PLANNED INTERVENTIONS: 97164- PT Re-evaluation, 97110-Therapeutic exercises, 97530- Therapeutic activity, 97112- Neuromuscular re-education, 97535- Self Care, [id number]- Manual therapy, [phone]- Gait training, Patient/Family education, Balance training, Stair training, Taping, and Joint mobilization  PLAN FOR NEXT SESSION: Nustep, progress concentric hip, knee and lower leg muscle strengthening exercises, continue balance exercises, joint mobilizations, modalities as needed.    Lane Pinon, PT 12/17/2023, 9:46 AM

## 2023-12-21 NOTE — Progress Notes (Signed)
 NEUROLOGY FOLLOW UP OFFICE NOTE  Tina Osborn [id number]  Subjective:  Tina Osborn is a 38 y.o. year old right-handed female with a medical history of migraines, tachycardia, anxiety, and GERD who we last saw on 06/30/23 for migraines.  To briefly review: Patient has had weakness and tachycardia for 2 years. The weakness was only on the left side (arm and leg). She saw her PCP who tried treating anxiety and reflux that helped those symptoms but not the pain, weakness, numbness, or tachycardia. There was concern it was cardiac so she saw cardiology and had multiple EKGs, Holter monitor, echocardiogram, and stress test. These were normal except the stress test had to be stopped early due to tachycardia. CT PE was negative on 03/08/22 as was CXR on 07/06/22.   Her weakness comes and goes. Sometimes it lasts 5 minutes, sometimes it lasts an hour. It occurs about every few days. She also has numbness and pain on the left. She has sharp, stabbing pain. It can take her breath away. She will have stabbing pain then achiness. It can last 5 minutes or can last days. The numbness and weakness usually come together, but the pain can be with or without the weakness. When she has pain, she can see her left pectoral muscle twitching.   Symptoms have been stable for 2 years.   Patient has migraines with aura. She may or may not always have a headache. She can lose vision in her left eye, have photophobia, phonophobia. She gets one headache per month since teenage years. Father also has migraines. She takes Sumatriptan  PRN for the migraines. She may have had a headache associated with her weakness. She has not taken it when she has a weakness episode.   Patient had lab work ordered by [facility] including ESR, CRP, and CK, but I cannot see if she has had this drawn. Per patient these were normal. She has been diagnosed with raynaud's.   The patient has not noticed any recent skin rashes  nor does she report any constitutional symptoms like fever, night sweats, anorexia or unintentional weight loss.   She denies significant trauma to her head. She has scars on her head but does not know what they are. She denies developmental delay. She denies previous brain infection. She was in the [employer].   EtOH use: Rare  Restrictive diet? No Family history of neuropathy/myopathy/neurologic disease? Father has similar symptoms with left sided weakness   Lives with husband, and 2 children (1 and 48 years old) Works as Advertising account planner   09/16/22: MRI cervical spine showed no concerning pathology. MRI brain was significant for a nonspecific 3 mm T2 hyperintensity in her anterior right frontal lobe subcortical white matter. These lesion would not definitively explain patient's symptoms, so decision was made to pursue EEG. She has not gotten a call about scheduling this.   Patient had an episode last night. Around 7 pm she had pain in her chest (tightness on the left pectoral muscle area). This was coming and going most of the night. She did not having the tingling and numbness in her arm. She woke up this morning to a full migraine. She did not take Imitrex  as she cannot go to work when she takes it due to sleepiness. Her headache today is similar to prior with photophobia, phonophobia, and some difficulty with finding words.   She is having about 1 headache per week. She takes sumatriptan  and naps for 2 hours and can then be  symptom free. There is no positional component to headaches and no vision changes.   Patient did try to take sumatriptan  during an episode of numbness and tingling on the left side, but it did not clearly help.   She denies history of kidney stones. She has previously tried propranolol but had sun sensitivity.    Topamax  50 mg daily was started on 09/16/22.   12/25/22: EEG on 09/17/22 was normal with no evidence of seizure or abnormal discharges to suggest possible  increased risk of seizure. She continues to have left sided numbness/tingling with chest pain. This has not changed much.   Patient started topamax  25 mg, but was not able to increase 50 mg due to it causing bladder problems. She has been on only 25 mg 11/24/22. She got a big headache ~12/14/22. She thinks topamax  has eliminated warning signs of headaches. She is having 2 small headaches per month and only had 1 big headache since last visit (09/16/22). She took Imitrex  only with the big headache. She did not check the headache early, so it did not work well. She did not redose. That headache lasted about 1 day. She spent all day in bed.   Overall, patient is not sure she wants to continue topamax  due to side effects and UTIs.  Nurtec every other day for migraine prevention was ordered on 12/25/22.  06/30/23: Per my telephone notes from 03/04/23: Called patient to discuss headaches. She is having intermittent dull and sharp headaches for the last few days. I have a peer to peer scheduled for this afternoon to appeal the denial of her Nurtec for migraine prevention. Patient would prefer to wait to see how this goes then maybe consider Medrol dosepak vs migraine cocktail in the office to break her current headache cycle.    Called patient to let her know that I did the peer to peer with her insurance. Her Nurtec was approved after that conversation. I was told patient could call her pharmacy in 1-2 hours and get the medication, which I relayed to patient.   She will let me know if she has any problems getting the medication or headaches do not improve.   Patient then needed another PA on 06/10/23 for Nurtec - approved 06/23/23. Patient has being doing well with the Nurtec. When she had the medication, she was only getting headaches every other week. When she gets a headache it is minor. The Sumatriptan  gets rid of her headaches.   Her left chest and arm has improved and is only sporadic, about the same  frequency as headaches when on Nurtec.   She has no new complaints.  Most recent Assessment and Plan (06/30/23): This is Tina Osborn, a 38 y.o. female with: Episodic migraine with aura - she could not tolerate propranolol previously and is now having difficulty with topamax , causing UTI at higher, more effective doses. She does not want to do an injectable, started Nurtec for prevention. She is doing well with sumatriptan  for rescue. Episodic left chest and arm paresthesias - This is of unclear etiology. Cardiac work up was reportedly normal. MRI brain and C spine were normal, as was EEG. This has improved with headaches while on Nurtec, so possibly migraine related (?)    Plan: Migraine prevention:  Nurtec 75 mg every other day Migraine rescue:  Sumatriptan  100 mg PRN at headache onset, can repeat after 2 hours if needed  Limit use of pain relievers to no more than 2 days out of week  to prevent risk of rebound or medication-overuse headache. Keep headache diary  Since their last visit: She is doing well. She has reduced frequency and intensity. She is getting a headache once every few weeks. They are so mild that she does not take sumatriptan  but usually ibuprofen . She has more photophobia than pain.  She has no more episodic left chest pain or arm paresthesias. She has not had these symptoms in a long time.  PCP had given patient metoprolol for palpitations. She recently had difficulty filling this and noticed while off of it, she felt better with more energy. She did not have any dizziness or worsening headaches during being off of it.  MEDICATIONS:  Outpatient Encounter Medications as of 12/29/2023  Medication Sig   escitalopram (LEXAPRO) 10 MG tablet Take 10 mg by mouth daily.   metoprolol succinate (TOPROL-XL) 25 MG 24 hr tablet Take 25 mg by mouth daily.   pantoprazole (PROTONIX) 40 MG tablet Take 40 mg by mouth daily.   SUMAtriptan  (IMITREX ) 100 MG tablet Take 1 tablet (100  mg total) by mouth once as needed for up to 1 dose for migraine. May repeat in 2 hours if headache persists or recurs.   [DISCONTINUED] NURTEC 75 MG TBDP Take 75 mg by mouth every other day.   fluconazole  (DIFLUCAN ) 150 MG tablet Take 1 tablet (150 mg total) by mouth every three (3) days as needed. (Patient not taking: Reported on 12/29/2023)   NURTEC 75 MG TBDP Take 1 tablet (75 mg total) by mouth every other day.   [EXPIRED] Rimegepant Sulfate (NURTEC) 75 MG TBDP Take 1 tablet (75 mg total) by mouth every other day.   [DISCONTINUED] cephALEXin  (KEFLEX ) 500 MG capsule Take 1 capsule (500 mg total) by mouth 2 (two) times daily.   No facility-administered encounter medications on file as of 12/29/2023.    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   Irregular heart rate    Medical history non-contributory     PAST SURGICAL HISTORY: Past Surgical History:  Procedure Laterality Date   LAPAROSCOPIC ASSISTED VAGINAL HYSTERECTOMY N/A 01/28/2022   Procedure: LAPAROSCOPIC ASSISTED VAGINAL HYSTERECTOMY;  Surgeon: Ozan, Jennifer, DO;  Location: [facility];  Service: Gynecology;  Laterality: N/A;   LAPAROSCOPIC BILATERAL SALPINGECTOMY Bilateral 01/28/2022   Procedure: LAPAROSCOPIC BILATERAL SALPINGECTOMY;  Surgeon: Ozan, Jennifer, DO;  Location: [facility];  Service: Gynecology;  Laterality: Bilateral;   NO PAST SURGERIES      ALLERGIES: Allergies  Allergen Reactions   Hydrocortisone  Rash    cream    FAMILY HISTORY: Family History  Problem Relation Age of Onset   Cancer Mother        ovarian   Hypertension Father    Varicose Veins Father        skin   Cancer Father    Cancer Paternal Grandmother        breast    SOCIAL HISTORY: Social History   Tobacco Use   Smoking status: Never   Smokeless tobacco: Never  Vaping Use   Vaping status: Never Used  Substance Use Topics   Alcohol  use: Yes    Comment: rare   Drug use: No   Social History   Social History Narrative   Are you right handed or left handed? Right handed   Are you currently employed ? 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 no   Who lives with you? Family    What type of home do you live in: 1 story or 2 story? 1 story     Caffeine 1-2 cups      Objective:  Vital Signs:  BP 112/72   Pulse 80   Ht 5' 6 (1.676 m)   Wt 181 lb (82.1 kg)   LMP 10/08/2021 Comment: negative urine test on 01/26/22  SpO2 98%   BMI 29.21 kg/m   General: No acute distress.  Patient appears well-groomed.   Head:  Normocephalic/atraumatic Eyes:  Fundi examined, disc margins clear, no obvious papilledema Neck: supple Heart:  Regular rate and rhythm Lungs:  Clear to auscultation bilaterally Back: No paraspinal tenderness Neurological Exam: alert and oriented.  Speech fluent and not dysarthric, language intact.  CN II-XII intact. Bulk and tone normal, muscle strength 5/5 throughout.  Sensation to light touch intact.  Deep tendon reflexes 2+ throughout.  Finger to nose testing intact.  Gait normal, Romberg negative.   Labs and Imaging review: No new results  Previously reviewed results: Normal or unremarkable: CBC, CMP (03/08/22) B12 (05/09/18): 663 TSH (04/25/18): 1.910   External labs: Lab work by Rheumatology:    Cervical spine xray (12/02/20): FNDINGS: There is no evidence of cervical spine fracture or prevertebral soft tissue swelling. Alignment is normal. No other significant bone abnormalities are identified.   IMPRESSION: Negative cervical spine radiographs.   MRI brain w/wo contrast (08/14/22): FINDINGS: Brain:   No age advanced or lobar predominant parenchymal atrophy.   Nonspecific 3 mm focus of T2 FLAIR hyperintense signal abnormality within the anterior right frontal lobe subcortical white matter (series 4, image 11) (series 9, image 15).   No  cortical encephalomalacia is identified.   There is no acute infarct.   No evidence of an intracranial mass.   No chronic intracranial blood products.   No extra-axial fluid collection.   No midline shift.   No pathologic intracranial enhancement identified.   Vascular: Maintained flow voids within the proximal large arterial vessels.   Skull and upper cervical spine: No focal suspicious marrow lesion.   Sinuses/Orbits: No mass or acute finding within the imaged orbits. Tiny mucous retention cyst within the right maxillary sinus.   IMPRESSION: No evidence of acute intracranial abnormality.   Nonspecific 3 mm T2 FLAIR hyperintense remote insult within the anterior right frontal lobe white matter. Otherwise unremarkable MRI appearance of the brain.   MRI cervical spine w/wo contrast (08/14/22): FINDINGS: Intermittently motion degraded examination (with up to moderate motion degradation of the acquired sequences).   Alignment: No significant spondylolisthesis.   Vertebrae: Vertebral body height is maintained. No significant marrow edema or focal suspicious osseous lesion. Hemangioma within the C4 vertebral body.   Cord: Within limitations of motion degradation, no signal abnormality is identified within the cervical spinal cord. No pathologic spinal cord enhancement.   Posterior Fossa, vertebral arteries, paraspinal tissues: Posterior fossa assessed on same-day brain MRI. Flow voids preserved within the imaged cervical vertebral arteries. No paraspinal mass or collection.   Disc levels:   Mild multilevel disc degeneration.   C2-C3: Shallow disc bulge. No significant spinal canal or foraminal stenosis.   C3-C4: Shallow disc bulge. Mild uncovertebral hypertrophy on the right. No significant spinal canal or foraminal stenosis.   C4-C5: Shallow disc bulge. No significant spinal canal or foraminal stenosis.   C5-C6: Disc bulge with superimposed small left center  disc protrusion. Mild effacement of the  ventral thecal sac (without spinal cord mass effect). No significant foraminal stenosis.   C6-C7: Shallow disc bulge. No significant spinal canal or foraminal stenosis.   C7-T1: No significant disc herniation or stenosis.   IMPRESSION: Intermittently motion degraded exam.   Cervical spondylosis, as outlined. No more than mild relative spinal canal narrowing. No significant foraminal stenosis. Mild multilevel disc degeneration.   No lesion is identified within the cervical spinal cord.   EEG (09/17/22): Description: The patient is awake and asleep during the recording.  During maximal wakefulness, there is a symmetric, medium voltage 10 Hz posterior dominant rhythm that attenuates with eye opening.  The record is symmetric.  During drowsiness and sleep, there is an increase in theta slowing of the background.  Vertex waves and symmetric sleep spindles were seen. Hyperventilation and photic stimulation did not elicit any abnormalities.  There were no epileptiform discharges or electrographic seizures seen.     EKG lead was unremarkable.   Impression: This 1-hour awake and asleep EEG is normal.    Assessment/Plan:  This is Tina Osborn, a 38 y.o. female with: Episodic migraine with aura - she could not tolerate propranolol previously and is now having difficulty with topamax , causing UTI at higher, more effective doses. She does not want to do an injectable, started Nurtec for prevention. She is doing well with sumatriptan  for rescue. Episodic left chest and arm paresthesias - This is of unclear etiology. Cardiac work up was reportedly normal. MRI brain and C spine were normal, as was EEG. This has improved with headaches while on Nurtec, so possibly migraine related (?). She has not had symptoms in a long time.   Plan: Migraine prevention:  Nurtec 75 mg every other day Migraine rescue:  Sumatriptan  100 mg PRN at headache onset, can repeat  after 2 hours if needed  Limit use of pain relievers to no more than 2 days out of week to prevent risk of rebound or medication-overuse headache. Keep headache diary  -Discuss with PCP about stopping metoprolol  Return to clinic in 1 year  Rommie Coats, MD

## 2023-12-29 ENCOUNTER — Ambulatory Visit: Payer: BC Managed Care – PPO | Admitting: Neurology

## 2023-12-29 ENCOUNTER — Encounter: Payer: Self-pay | Admitting: Neurology

## 2023-12-29 VITALS — BP 112/72 | HR 80 | Ht 66.0 in | Wt 181.0 lb

## 2023-12-29 DIAGNOSIS — H53149 Visual discomfort, unspecified: Secondary | ICD-10-CM

## 2023-12-29 DIAGNOSIS — G43109 Migraine with aura, not intractable, without status migrainosus: Secondary | ICD-10-CM

## 2023-12-29 DIAGNOSIS — R29818 Other symptoms and signs involving the nervous system: Secondary | ICD-10-CM

## 2023-12-29 MED ORDER — NURTEC 75 MG PO TBDP
75.0000 mg | ORAL_TABLET | ORAL | 11 refills | Status: AC
Start: 2023-12-29 — End: ?

## 2023-12-29 NOTE — Patient Instructions (Signed)
 Migraine prevention:  Nurtec 75 mg every other day Migraine rescue:  Sumatriptan  100 mg as needed at headache onset, can repeat after 2 hours if needed  Limit use of pain relievers to no more than 2 days out of week to prevent risk of rebound or medication-overuse headache. Keep headache diary  -Discuss with PCP about stopping metoprolol  Return to clinic in 1 year  The physicians and staff at [facility] are committed to providing excellent care. You may receive a survey requesting feedback about your experience at our office. We strive to receive very good responses to the survey questions. If you feel that your experience would prevent you from giving the office a very good  response, please contact our office to try to remedy the situation. We may be reached at [phone]. Thank you for taking the time out of your busy day to complete the survey.  Rommie Coats, MD [facility]

## 2023-12-31 ENCOUNTER — Encounter

## 2024-03-08 ENCOUNTER — Other Ambulatory Visit: Payer: Self-pay | Admitting: Neurology

## 2024-03-08 ENCOUNTER — Encounter: Payer: Self-pay | Admitting: Neurology

## 2024-03-08 DIAGNOSIS — G43109 Migraine with aura, not intractable, without status migrainosus: Secondary | ICD-10-CM

## 2024-03-08 MED ORDER — SUMATRIPTAN SUCCINATE 50 MG PO TABS
50.0000 mg | ORAL_TABLET | Freq: Once | ORAL | 5 refills | Status: AC | PRN
Start: 2024-03-08 — End: ?

## 2024-03-26 ENCOUNTER — Telehealth: Admitting: Family

## 2024-03-26 DIAGNOSIS — B3731 Acute candidiasis of vulva and vagina: Secondary | ICD-10-CM

## 2024-03-26 MED ORDER — FLUCONAZOLE 150 MG PO TABS
150.0000 mg | ORAL_TABLET | ORAL | 0 refills | Status: DC | PRN
Start: 2024-03-26 — End: 2024-04-26

## 2024-03-26 NOTE — Progress Notes (Signed)

## 2024-03-26 NOTE — Progress Notes (Signed)
Approximately 5 minutes was spent documenting and reviewing patient's chart.

## 2024-04-26 ENCOUNTER — Telehealth: Admitting: Physician Assistant

## 2024-04-26 DIAGNOSIS — B3731 Acute candidiasis of vulva and vagina: Secondary | ICD-10-CM | POA: Diagnosis not present

## 2024-04-26 MED ORDER — FLUCONAZOLE 150 MG PO TABS
ORAL_TABLET | ORAL | 0 refills | Status: AC
Start: 2024-04-26 — End: ?

## 2024-04-26 NOTE — Progress Notes (Signed)

## 2024-05-08 ENCOUNTER — Ambulatory Visit
Admission: EM | Admit: 2024-05-08 | Discharge: 2024-05-08 | Disposition: A | Discharge status: Home / Self Care | Attending: Nurse Practitioner | Admitting: Nurse Practitioner

## 2024-05-08 DIAGNOSIS — J22 Unspecified acute lower respiratory infection: Secondary | ICD-10-CM

## 2024-05-08 DIAGNOSIS — R059 Cough, unspecified: Secondary | ICD-10-CM

## 2024-05-08 MED ORDER — AZITHROMYCIN 250 MG PO TABS: 250.0000 mg | ORAL_TABLET | Freq: Every day | ORAL | 0 refills | Status: AC

## 2024-05-08 MED ORDER — PROMETHAZINE-DM 6.25-15 MG/5ML PO SYRP: 5.0000 mL | ORAL_SOLUTION | Freq: Four times a day (QID) | ORAL | 0 refills | Status: AC | PRN

## 2024-05-08 MED ORDER — PREDNISONE 20 MG PO TABS: 40.0000 mg | ORAL_TABLET | Freq: Every day | ORAL | 0 refills | Status: AC

## 2024-05-08 NOTE — Discharge Instructions (Signed)
 Take medication as prescribed. Increase fluids and allow for plenty of rest. You may take over-the-counter Tylenol  as needed for pain, fever, or general discomfort.  Do not take ibuprofen  when you are taking the prednisone. Increase fluids and allow for plenty of rest. Recommend use of a humidifier in your bedroom at nighttime during sleep and sleeping elevated on pillows while cough symptoms persist. As discussed, your cough may last from days to weeks.  If you are generally feeling well but continued to have a persistent nagging cough, continue over-the-counter cough and cold medications, cough drops, and fluids.  Seek care if you develop fever, chills, wheezing, difficulty breathing, or other concerns. Follow-up as needed.

## 2024-05-08 NOTE — ED Provider Notes (Signed)
 [facility]    CSN: [id number] Arrival date & time: 05/08/24  0804      History   Chief Complaint Chief Complaint  Patient presents with   Cough    HPI Tina Osborn is a 38 y.o. female.   The history is provided by the patient.   Patient presents with a more than 1 week history of cough, and postnasal drainage.  Patient denies fever, chills, headache, ear pain, wheezing, difficulty breathing, abdominal pain, nausea, vomiting, diarrhea, or rash.  Patient reports that at 1 point she was getting better, but over the last couple of days she has been getting worse.  She states she has been coughing up green phlegm consistently.  So far, states she has been taking over-the-counter cough and cold medications for her symptoms.  She reports underlying history of seasonal allergies.  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   Irregular heart rate    Medical history non-contributory     Patient Active Problem List   Diagnosis Date Noted   Ineffective breast feeding 04/25/2013   HSV-2 seropositive     Past Surgical History:  Procedure Laterality Date   LAPAROSCOPIC ASSISTED VAGINAL HYSTERECTOMY N/A 01/28/2022   Procedure: LAPAROSCOPIC ASSISTED VAGINAL HYSTERECTOMY;  Surgeon: Ozan, Jennifer, DO;  Location: [facility];  Service: Gynecology;  Laterality: N/A;   LAPAROSCOPIC BILATERAL SALPINGECTOMY Bilateral 01/28/2022   Procedure: LAPAROSCOPIC BILATERAL SALPINGECTOMY;  Surgeon: Ozan, Jennifer, DO;  Location: [facility];  Service: Gynecology;  Laterality: Bilateral;   NO PAST SURGERIES      OB History     Gravida  2   Para  2   Term  2   Preterm      AB      Living  2      SAB      IAB      Ectopic      Multiple      Live Births  2            Home Medications    Prior to Admission medications   Medication Sig Start Date End Date Taking? Authorizing Provider   azithromycin (ZITHROMAX) 250 MG tablet Take 1 tablet (250 mg total) by mouth daily. Take first 2 tablets together, then 1 every day until finished. 05/08/24  Yes Leath-Warren, Etta PARAS, NP  escitalopram (LEXAPRO) 10 MG tablet Take 10 mg by mouth daily. 08/17/21  Yes [provider]  pantoprazole (PROTONIX) 40 MG tablet Take 40 mg by mouth daily. 04/01/20  Yes [provider]  predniSONE (DELTASONE) 20 MG tablet Take 2 tablets (40 mg total) by mouth daily with breakfast for 5 days. 05/08/24 05/13/24 Yes Leath-Warren, Etta PARAS, NP  promethazine-dextromethorphan (PROMETHAZINE-DM) 6.25-15 MG/5ML syrup Take 5 mLs by mouth 4 (four) times daily as needed. 05/08/24  Yes Leath-Warren, Etta PARAS, NP  fluconazole  (DIFLUCAN ) 150 MG tablet Take 1 tablet PO once. Repeat in 3 days if needed. 04/26/24   Gladis Elsie BROCKS, PA-C  metoprolol succinate (TOPROL-XL) 25 MG 24 hr tablet Take 25 mg by mouth daily. 10/22/21   [provider]  NURTEC 75 MG TBDP Take 1 tablet (75 mg total) by mouth every other day. 12/29/23   Leigh Venetia CROME, MD  SUMAtriptan  (IMITREX ) 50 MG tablet Take 1 tablet (50 mg total) by mouth once as needed for up to 1 dose for migraine. May repeat in 2 hours  if headache persists or recurs. 03/08/24   Leigh Venetia CROME, MD    Family History Family History  Problem Relation Age of Onset   Cancer Mother        ovarian   Hypertension Father    Varicose Veins Father        skin   Cancer Father    Cancer Paternal Grandmother        breast    Social History Social History   Tobacco Use   Smoking status: Never   Smokeless tobacco: Never  Vaping Use   Vaping status: Never Used  Substance Use Topics   Alcohol use: Yes    Comment: rare   Drug use: No     Allergies   Hydrocortisone    Review of Systems Review of Systems Per HPI  Physical Exam Triage Vital Signs ED Triage Vitals  Encounter Vitals Group     BP 05/08/24 0827 123/88     Girls Systolic BP  Percentile --      Girls Diastolic BP Percentile --      Boys Systolic BP Percentile --      Boys Diastolic BP Percentile --      Pulse Rate 05/08/24 0827 83     Resp 05/08/24 0827 18     Temp 05/08/24 0827 98.8 F (37.1 C)     Temp Source 05/08/24 0827 Oral     SpO2 05/08/24 0827 95 %     Weight --      Height --      Head Circumference --      Peak Flow --      Pain Score 05/08/24 0828 0     Pain Loc --      Pain Education --      Exclude from Growth Chart --    No data found.  Updated Vital Signs BP 123/88 (BP Location: Right Arm)   Pulse 83   Temp 98.8 F (37.1 C) (Oral)   Resp 18   LMP 10/08/2021 Comment: negative urine test on 01/26/22  SpO2 95%   Visual Acuity Right Eye Distance:   Left Eye Distance:   Bilateral Distance:    Right Eye Near:   Left Eye Near:    Bilateral Near:     Physical Exam Vitals and nursing note reviewed.  Constitutional:      General: She is not in acute distress.    Appearance: Normal appearance.  HENT:     Head: Normocephalic.     Right Ear: Tympanic membrane, ear canal and external ear normal.     Left Ear: Tympanic membrane, ear canal and external ear normal.     Nose: Nose normal.     Right Turbinates: Enlarged and swollen.     Left Turbinates: Enlarged and swollen.     Right Sinus: No maxillary sinus tenderness or frontal sinus tenderness.     Left Sinus: No maxillary sinus tenderness or frontal sinus tenderness.     Mouth/Throat:     Lips: Pink.     Mouth: Mucous membranes are moist.     Pharynx: Postnasal drip present. No pharyngeal swelling, oropharyngeal exudate, posterior oropharyngeal erythema or uvula swelling.     Comments: Cobblestoning present to posterior oropharynx  Eyes:     Extraocular Movements: Extraocular movements intact.     Conjunctiva/sclera: Conjunctivae normal.     Pupils: Pupils are equal, round, and reactive to light.  Cardiovascular:     Rate and Rhythm: Normal rate  and regular rhythm.      Pulses: Normal pulses.     Heart sounds: Normal heart sounds.  Pulmonary:     Effort: Pulmonary effort is normal. No respiratory distress.     Breath sounds: Normal breath sounds. No stridor. No wheezing, rhonchi or rales.  Abdominal:     General: Bowel sounds are normal.     Palpations: Abdomen is soft.     Tenderness: There is no abdominal tenderness.  Musculoskeletal:     Cervical back: Normal range of motion.  Skin:    General: Skin is warm and dry.  Neurological:     General: No focal deficit present.     Mental Status: She is alert and oriented to person, place, and time.  Psychiatric:        Mood and Affect: Mood normal.        Behavior: Behavior normal.      UC Treatments / Results  Labs (all labs ordered are listed, but only abnormal results are displayed) Labs Reviewed - No data to display  EKG   Radiology No results found.  Procedures Procedures (including critical care time)  Medications Ordered in UC Medications - No data to display  Initial Impression / Assessment and Plan / UC Course  I have reviewed the triage vital signs and the nursing notes.  Pertinent labs & imaging results that were available during my care of the patient were reviewed by me and considered in my medical decision making (see chart for details).  Patient with a more than 1 week history of cough.  Patient states cough has gotten worse over the past several days.  On exam, lung sounds are clear throughout, room air sats at 95%.  Will cover for lower respiratory infection with azithromycin 250 mg, prednisone 40 mg, and Promethazine DM.  Supportive care recommendations were provided and discussed with the patient to include fluids, rest, over-the-counter analgesics, and use of a humidifier during sleep.  Discussed indications with patient regarding follow-up.  Patient was in agreement with this plan of care and verbalizes understanding.  All questions were answered.  Patient stable for  discharge.   Final Clinical Impressions(s) / UC Diagnoses   Final diagnoses:  Lower respiratory infection  Cough, unspecified type     Discharge Instructions      Take medication as prescribed. Increase fluids and allow for plenty of rest. You may take over-the-counter Tylenol  as needed for pain, fever, or general discomfort.  Do not take ibuprofen  when you are taking the prednisone. Increase fluids and allow for plenty of rest. Recommend use of a humidifier in your bedroom at nighttime during sleep and sleeping elevated on pillows while cough symptoms persist. As discussed, your cough may last from days to weeks.  If you are generally feeling well but continued to have a persistent nagging cough, continue over-the-counter cough and cold medications, cough drops, and fluids.  Seek care if you develop fever, chills, wheezing, difficulty breathing, or other concerns. Follow-up as needed.     ED Prescriptions     Medication Sig Dispense Auth. Provider   promethazine-dextromethorphan (PROMETHAZINE-DM) 6.25-15 MG/5ML syrup Take 5 mLs by mouth 4 (four) times daily as needed. 118 mL Leath-Warren, Etta PARAS, NP   azithromycin (ZITHROMAX) 250 MG tablet Take 1 tablet (250 mg total) by mouth daily. Take first 2 tablets together, then 1 every day until finished. 6 tablet Leath-Warren, Etta PARAS, NP   predniSONE (DELTASONE) 20 MG tablet Take 2 tablets (40 mg  total) by mouth daily with breakfast for 5 days. 10 tablet Leath-Warren, Etta PARAS, NP      PDMP not reviewed this encounter.   Gilmer Etta PARAS, NP 05/08/24 [phone]

## 2024-05-08 NOTE — ED Triage Notes (Signed)
 Cough x 1 week. Taking dyquil with no relief of symptoms.

## 2024-07-05 ENCOUNTER — Encounter: Payer: Self-pay | Admitting: Neurology

## 2024-07-06 ENCOUNTER — Telehealth: Payer: Self-pay

## 2024-07-10 ENCOUNTER — Other Ambulatory Visit (HOSPITAL_COMMUNITY): Payer: Self-pay

## 2024-07-12 ENCOUNTER — Other Ambulatory Visit (HOSPITAL_COMMUNITY): Payer: Self-pay

## 2024-07-17 ENCOUNTER — Other Ambulatory Visit (HOSPITAL_COMMUNITY): Payer: Self-pay

## 2024-07-17 NOTE — Telephone Encounter (Signed)
 PA not needed. Last filled on 12.19.25. Next fill available on 1.11.26.

## 2024-07-17 NOTE — Telephone Encounter (Signed)
 [facility]RA

## 2024-12-28 ENCOUNTER — Ambulatory Visit: Admitting: Neurology
